# Patient Record
Sex: Female | Born: 2000 | Race: White | Hispanic: No
Health system: Midwestern US, Community
[De-identification: ages and names within clinical notes are randomized; demographics above are authoritative.]

## PROBLEM LIST (undated history)

## (undated) DIAGNOSIS — M779 Enthesopathy, unspecified: Secondary | ICD-10-CM

## (undated) DIAGNOSIS — K219 Gastro-esophageal reflux disease without esophagitis: Secondary | ICD-10-CM

## (undated) DIAGNOSIS — L309 Dermatitis, unspecified: Secondary | ICD-10-CM

## (undated) HISTORY — PX: ESOPHAGOSCOPY: SUR460

## (undated) HISTORY — PX: TONSILLECTOMY AND ADENOIDECTOMY: SUR1326

## (undated) HISTORY — PX: TYMPANOSTOMY TUBE PLACEMENT: SHX32

---
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
## 2021-07-04 NOTE — L&D Delivery Note (Cosign Needed Addendum)
OB/GYN Faculty Practice Delivery Note  Charlene Buckley is a 21 y.o. G1P0 s/p SVD at [redacted]w[redacted]d. She was admitted for IOL for maternal tachycardia.   ROM: 12h 65m with clear fluid GBS Status:  --Henderson Cloud (09/13 1330) Maximum Maternal Temperature:  Temp (24hrs), Avg:98.3 F (36.8 C), Min:98.1 F (36.7 C), Max:98.4 F (36.9 C)    Labor Progress: Patient arrived at 5 cm dilation and was induced with OP FB, cytotec, AROM, Pitocin.   Delivery Date/Time: 04/05/2022 at 1735 Delivery: Called to room and patient was complete and pushing. Head delivered in ROA position. Nuchal cord x1. Shoulder and body delivered in usual fashion. Infant with cry after stimulation, placed on mother's abdomen, dried and stimulated. Cord clamped x 2 after 1-minute delay, and cut by FOB. Cord blood drawn. Placenta delivered spontaneously with gentle cord traction and suprapubic pressure. Fundus firm with massage and Pitocin. Labia, perineum, vagina, and cervix inspected with mild bilateral labial and perineal abrasions.   Placenta: delivered at 4098 Complications: None Lacerations: mild bilateral labial and perineal abrasions EBL: 76 mL  Analgesia: epidural    Infant: APGAR (1 MIN): 8   APGAR (5 MINS): 9   APGAR (10 MINS):  pending  Weight: pending  Lowry Ram, MD  PGY-1, [facility]  04/05/2022 6:22 PM   Fellow ATTESTATION  I was present and gloved for this delivery and agree with the above documentation in the resident's note   [place], [place] for [employer] (Faculty Practice) 04/05/2022, 6:59 PM

## 2021-08-02 ENCOUNTER — Inpatient Hospital Stay (HOSPITAL_COMMUNITY)
Admission: AD | Admit: 2021-08-02 | Discharge: 2021-08-03 | Disposition: A | Discharge status: Home / Self Care | Payer: Medicaid Other | Attending: Obstetrics and Gynecology | Admitting: Obstetrics and Gynecology

## 2021-08-02 ENCOUNTER — Other Ambulatory Visit: Payer: Self-pay

## 2021-08-02 ENCOUNTER — Inpatient Hospital Stay (HOSPITAL_COMMUNITY): Payer: Medicaid Other

## 2021-08-02 ENCOUNTER — Encounter (HOSPITAL_COMMUNITY): Payer: Self-pay | Admitting: Emergency Medicine

## 2021-08-02 DIAGNOSIS — O26891 Other specified pregnancy related conditions, first trimester: Secondary | ICD-10-CM | POA: Diagnosis present

## 2021-08-02 DIAGNOSIS — Z6710 Type A blood, Rh positive: Secondary | ICD-10-CM | POA: Diagnosis not present

## 2021-08-02 DIAGNOSIS — R1030 Lower abdominal pain, unspecified: Secondary | ICD-10-CM | POA: Insufficient documentation

## 2021-08-02 DIAGNOSIS — O3680X0 Pregnancy with inconclusive fetal viability, not applicable or unspecified: Secondary | ICD-10-CM | POA: Diagnosis not present

## 2021-08-02 DIAGNOSIS — R110 Nausea: Secondary | ICD-10-CM | POA: Insufficient documentation

## 2021-08-02 DIAGNOSIS — R109 Unspecified abdominal pain: Secondary | ICD-10-CM | POA: Diagnosis not present

## 2021-08-02 DIAGNOSIS — Z3A01 Less than 8 weeks gestation of pregnancy: Secondary | ICD-10-CM | POA: Insufficient documentation

## 2021-08-02 LAB — I-STAT BETA HCG BLOOD, ED (MC, WL, AP ONLY): I-stat hCG, quantitative: 30.7 m[IU]/mL — ABNORMAL HIGH (ref <5)

## 2021-08-02 LAB — URINALYSIS, MICROSCOPIC (REFLEX)

## 2021-08-02 LAB — HCG, QUANTITATIVE, PREGNANCY: hCG, Beta Chain, Quant, S: 33 m[IU]/mL — ABNORMAL HIGH (ref <5)

## 2021-08-02 LAB — WET PREP, GENITAL
Clue Cells Wet Prep HPF POC: NONE SEEN
Sperm: NONE SEEN
Trich, Wet Prep: NONE SEEN
WBC, Wet Prep HPF POC: >=10 — AB (ref <10)
Yeast Wet Prep HPF POC: NONE SEEN

## 2021-08-02 LAB — URINALYSIS, ROUTINE W REFLEX MICROSCOPIC
Bilirubin Urine: NEGATIVE
Glucose, UA: NEGATIVE mg/dL
Hgb urine dipstick: NEGATIVE
Ketones, ur: NEGATIVE mg/dL
Nitrite: NEGATIVE
Protein, ur: NEGATIVE mg/dL
Specific Gravity, Urine: 1.01 (ref 1.005–1.030)
pH: 8 (ref 5.0–8.0)

## 2021-08-02 LAB — CBC
HCT: 36.5 % (ref 36.0–46.0)
Hemoglobin: 12.2 g/dL (ref 12.0–15.0)
MCH: 28.5 pg (ref 26.0–34.0)
MCHC: 33.4 g/dL (ref 30.0–36.0)
MCV: 85.3 fL (ref 80.0–100.0)
Platelets: 372 10*3/uL (ref 150–400)
RBC: 4.28 MIL/uL (ref 3.87–5.11)
RDW: 13.9 % (ref 11.5–15.5)
WBC: 7.7 10*3/uL (ref 4.0–10.5)
nRBC: 0 % (ref 0.0–0.2)

## 2021-08-02 LAB — ABO/RH: ABO/RH(D): A POS

## 2021-08-02 MED ORDER — ONDANSETRON 4 MG PO TBDP
8.0000 mg | ORAL_TABLET | Freq: Once | ORAL | Status: AC
  Administered 2021-08-02: 8 mg via ORAL
  Filled 2021-08-02: qty 2

## 2021-08-02 NOTE — MAU Note (Signed)
Pt transferred from ED with positive pregnancy test. Reports mid lower abdominal cramping that started about 2 hours ago. Denies any VB or abnormal discharge. Also reports nausea. Rates pain 6/10. LMP: 07/06/2021  Vitals:  T 98.7 P 87 BP 124/82 O2 100 R 18

## 2021-08-02 NOTE — ED Provider Triage Note (Signed)
Emergency Medicine Provider Triage Evaluation Note  Charlene Buckley , a 21 y.o. female  was evaluated in triage.  Pt complains of abdominal cramping. Pt took home pregnancy test that was positive. She has an OBGYN appointment tomorrow but states her abdominal cramping has been so bad in the past 2 hours she didn't want to wait.  Review of Systems  Positive: Abdominal cramping, nausea Negative: Vaginal bleeding, pelvic pain  Physical Exam  BP 125/80 (BP Location: Right Arm)    Pulse (!) 102    Temp 99.9 F (37.7 C) (Oral)    Resp 18    Ht 5\' 3"  (1.6 m)    Wt 68 kg    SpO2 99%    BMI 26.57 kg/m  Gen:   Awake, no distress   Resp:  Normal effort  MSK:   Moves extremities without difficulty  Other:    Medical Decision Making  Medically screening exam initiated at 9:04 PM.  Appropriate orders placed.  Charlene Buckley was informed that the remainder of the evaluation will be completed by another provider, this initial triage assessment does not replace that evaluation, and the importance of remaining in the ED until their evaluation is complete.  Called MAU physician who accepted patient transfer   Estill Cotta 08/02/21 2125

## 2021-08-02 NOTE — ED Notes (Signed)
Gave report to MAU.  Called transport.

## 2021-08-02 NOTE — ED Triage Notes (Signed)
Pt reports she took a home pregnancy test and found out that she was pregnant.She has a Dr. Appointment tomorrow however she is experiencing intense abdominal cramping X 2 hours.  No bleeding but some nausea

## 2021-08-03 DIAGNOSIS — O26891 Other specified pregnancy related conditions, first trimester: Secondary | ICD-10-CM | POA: Diagnosis not present

## 2021-08-03 DIAGNOSIS — R109 Unspecified abdominal pain: Secondary | ICD-10-CM

## 2021-08-03 DIAGNOSIS — O3680X0 Pregnancy with inconclusive fetal viability, not applicable or unspecified: Secondary | ICD-10-CM

## 2021-08-03 DIAGNOSIS — Z3A01 Less than 8 weeks gestation of pregnancy: Secondary | ICD-10-CM | POA: Diagnosis not present

## 2021-08-03 LAB — GC/CHLAMYDIA PROBE AMP (~~LOC~~) NOT AT ARMC
Chlamydia: NEGATIVE
Comment: NEGATIVE
Comment: NORMAL
Neisseria Gonorrhea: NEGATIVE

## 2021-08-03 NOTE — MAU Provider Note (Addendum)
Chief Complaint:  Abdominal Cramping   Event Date/Time   First Provider Initiated Contact with Patient 08/02/21 2227     HPI: Charlene Buckley is a 21 y.o. G1P0 at [redacted]w[redacted]d who presents to maternity admissions reporting centralized lower abdominal pain and tenderness. A few days ago, she noted very tender breasts and mostly right-sided abdominal pain which got worse and then better. Pain now central and breasts still tender with some nausea, but no vaginal bleeding or vomiting.   Pregnancy Course: Has not established care anywhere yet, is in the [employer].  History reviewed. No pertinent past medical history. OB History  Gravida Para Term Preterm AB Living  1            SAB IAB Ectopic Multiple Live Births               # Outcome Date GA Lbr Len/2nd Weight Sex Delivery Anes PTL Lv  1 Current            History reviewed. No pertinent surgical history. History reviewed. No pertinent family history. Social History   Tobacco Use   Smoking status: Never    Passive exposure: Never   Smokeless tobacco: Never  Substance Use Topics   Alcohol use: Never   Drug use: Never   Allergies  Allergen Reactions   Penicillins Anaphylaxis   No medications prior to admission.   I have reviewed patient's Past Medical Hx, Surgical Hx, Family Hx, Social Hx, medications and allergies.   ROS:  Review of Systems  Constitutional:  Negative for chills, fatigue and fever.  HENT:  Negative for congestion and sore throat.   Eyes:  Negative for visual disturbance.  Respiratory:  Negative for cough and shortness of breath.   Gastrointestinal:  Positive for abdominal pain and nausea.  Genitourinary:  Negative for dysuria, flank pain, urgency, vaginal bleeding, vaginal discharge and vaginal pain.  Neurological:  Negative for syncope and headaches.   Physical Exam  Patient Vitals for the past 24 hrs:  BP Temp Temp src Pulse Resp SpO2 Height Weight  08/03/21 0022 110/69 -- -- 81 -- -- -- --  08/02/21 2153  124/82 98.7 F (37.1 C) Oral 87 18 100 % 5\' 3"  (1.6 m) 147 lb 14.4 oz (67.1 kg)  08/02/21 2055 125/80 99.9 F (37.7 C) Oral (!) 102 18 99 % -- --  08/02/21 2051 -- -- -- -- -- -- 5\' 3"  (1.6 m) 150 lb (68 kg)   Constitutional: Well-developed, well-nourished female in no acute distress.  Cardiovascular: normal rate & rhythm Respiratory: normal effort GI: Abd soft, tender just above pubis symphysis MS: Extremities nontender, no edema, normal ROM Neurologic: Alert and oriented x 4.  GU: no CVA tenderness Pelvic: exam deferred, RN collected swabs, pt sent to U/S  Labs: Results for orders placed or performed during the hospital encounter of 08/02/21 (from the past 24 hour(s))  I-Stat Beta hCG blood, ED ([facility], [facility], [facility] only)     Status: Abnormal   Collection Time: 08/02/21  9:09 PM  Result Value Ref Range   I-stat hCG, quantitative 30.7 (H) <5 mIU/mL   Comment 3          CBC     Status: None   Collection Time: 08/02/21 10:31 PM  Result Value Ref Range   WBC 7.7 4.0 - 10.5 K/uL   RBC 4.28 3.87 - 5.11 MIL/uL   Hemoglobin 12.2 12.0 - 15.0 g/dL   HCT 40.3 47.4 - 25.9 %  MCV 85.3 80.0 - 100.0 fL   MCH 28.5 26.0 - 34.0 pg   MCHC 33.4 30.0 - 36.0 g/dL   RDW 13.9 11.5 - 15.5 %   Platelets 372 150 - 400 K/uL   nRBC 0.0 0.0 - 0.2 %  ABO/Rh     Status: None   Collection Time: 08/02/21 10:31 PM  Result Value Ref Range   ABO/RH(D) A POS    No rh immune globuloin      NOT A RH IMMUNE GLOBULIN CANDIDATE, PT RH POSITIVE Performed at [facility] [address], [place], [place] [address]   hCG, quantitative, pregnancy     Status: Abnormal   Collection Time: 08/02/21 10:31 PM  Result Value Ref Range   hCG, Beta Chain, Quant, S 33 (H) <5 mIU/mL  Wet prep, genital     Status: Abnormal   Collection Time: 08/02/21 10:57 PM  Result Value Ref Range   Yeast Wet Prep HPF POC NONE SEEN NONE SEEN   Trich, Wet Prep NONE SEEN NONE SEEN   Clue Cells Wet Prep HPF POC NONE SEEN NONE SEEN   WBC, Wet  Prep HPF POC >=10 (A) <10   Sperm NONE SEEN   Urinalysis, Routine w reflex microscopic     Status: Abnormal   Collection Time: 08/02/21 11:01 PM  Result Value Ref Range   Color, Urine YELLOW YELLOW   APPearance CLOUDY (A) CLEAR   Specific Gravity, Urine 1.010 1.005 - 1.030   pH 8.0 5.0 - 8.0   Glucose, UA NEGATIVE NEGATIVE mg/dL   Hgb urine dipstick NEGATIVE NEGATIVE   Bilirubin Urine NEGATIVE NEGATIVE   Ketones, ur NEGATIVE NEGATIVE mg/dL   Protein, ur NEGATIVE NEGATIVE mg/dL   Nitrite NEGATIVE NEGATIVE   Leukocytes,Ua TRACE (A) NEGATIVE  Urinalysis, Microscopic (reflex)     Status: Abnormal   Collection Time: 08/02/21 11:01 PM  Result Value Ref Range   RBC / HPF 0-5 0 - 5 RBC/hpf   WBC, UA 0-5 0 - 5 WBC/hpf   Bacteria, UA FEW (A) NONE SEEN   Squamous Epithelial / LPF 11-20 0 - 5   Mucus PRESENT    Amorphous Crystal PRESENT    Imaging:  US OB LESS THAN 14 WEEKS WITH OB TRANSVAGINAL  Result Date: 08/02/2021 CLINICAL DATA:  Pelvic pain for 2 hours with positive pregnancy test EXAM: OBSTETRIC <14 WK [place] AND TRANSVAGINAL OB US TECHNIQUE: Both transabdominal and transvaginal ultrasound examinations were performed for complete evaluation of the gestation as well as the maternal uterus, adnexal regions, and pelvic cul-de-sac. Transvaginal technique was performed to assess early pregnancy. COMPARISON:  None. FINDINGS: Intrauterine gestational sac: Absent Maternal uterus/adnexae: Ovaries appear within normal limits. Mild free fluid is noted which may be physiologic in nature. IMPRESSION: No evidence of intrauterine or extrauterine gestational sac. Given the low HCG level of 30.7 this likely represents an extremely early pregnancy. Correlation with follow-up beta HCG levels is recommended. Follow-up ultrasound can be performed as clinically necessary. Electronically Signed   By: Inez Catalina M.D.   On: 08/02/2021 23:41    MAU Course: Orders Placed This Encounter  Procedures   Wet prep,  genital   Culture, OB Urine   US OB LESS THAN 14 WEEKS WITH OB TRANSVAGINAL   CBC   hCG, quantitative, pregnancy   Urinalysis, Routine w reflex microscopic   Urinalysis, Microscopic (reflex)   I-Stat Beta hCG blood, ED ([facility], [facility], [facility] only)   ABO/Rh   Discharge patient  Meds ordered this encounter  Medications   ondansetron (ZOFRAN-ODT) disintegrating tablet 8 mg   MDM: Ectopic workup and UA ordered. Zofran given for nausea.  U/S showed nothing in uterus with HCG=30. Discussed pregnancy of unknown location with patient and need for follow up on Wednesday in our office to watch quantitative HCG. Reviewed ectopic precautions and reasons for returning to MAU. Advised if she has any light bleeding to follow up in the office on Wednesday as a quant would show viability of pregnancy. Pt verbalized understanding.  Urine culture ordered. Stat HCG appt scheduled on 08/04/21 at [facility].  Assessment: 1. [redacted] weeks gestation of pregnancy   2. Abdominal pain during pregnancy in first trimester   3. Pregnancy of unknown anatomic location    Plan: Discharge home in stable condition with ectopic precautions.     Follow-up [place] [facility] Follow up in 1 day(s).   Specialty: Obstetrics and Gynecology Why: 1:50pm on Wednesday for repeat bloodwork Contact information: [address] [place] [phone]                Allergies as of 08/03/2021       Reactions   Penicillins Anaphylaxis        Medication List    You have not been prescribed any medications.    Gaylan Gerold, CNM, MSN, [place] Certified Nurse Midwife, [facility]

## 2021-08-04 ENCOUNTER — Other Ambulatory Visit (INDEPENDENT_AMBULATORY_CARE_PROVIDER_SITE_OTHER): Payer: Medicaid Other

## 2021-08-04 ENCOUNTER — Other Ambulatory Visit: Payer: Self-pay

## 2021-08-04 VITALS — BP 102/67 | HR 80 | Wt 146.5 lb

## 2021-08-04 DIAGNOSIS — R109 Unspecified abdominal pain: Secondary | ICD-10-CM

## 2021-08-04 DIAGNOSIS — O26899 Other specified pregnancy related conditions, unspecified trimester: Secondary | ICD-10-CM

## 2021-08-04 LAB — CULTURE, OB URINE: Culture: >=100000 — AB

## 2021-08-04 LAB — BETA HCG QUANT (REF LAB): hCG Quant: 80 m[IU]/mL

## 2021-08-04 NOTE — Progress Notes (Signed)
Patient here today for stat hCG following up from MAU visit on 08/02/21 for abdominal pain. Patient denies any abdominal pain or vaginal bleeding today. Paitent's hCG on 08/02/21 was 30.7. Patient's hCG today was 80. I reviewed this with Dr. Para March. Per Dr. Para March patient to have an ultrasound in 2 weeks. Patient ultrasound scheduled for 08/18/21 at 1:45 PM. I called patient and reviewed this with her. I also reviewed ectopic precautions with patient. Patient denies any other concerns or questions.   Alesia Richards, RN 08/04/21

## 2021-08-05 ENCOUNTER — Telehealth: Payer: Self-pay | Admitting: Family Medicine

## 2021-08-05 DIAGNOSIS — R8271 Bacteriuria: Secondary | ICD-10-CM | POA: Insufficient documentation

## 2021-08-05 MED ORDER — NITROFURANTOIN MONOHYD MACRO 100 MG PO CAPS: 100.0000 mg | ORAL_CAPSULE | Freq: Two times a day (BID) | ORAL | 0 refills | Status: DC

## 2021-08-05 NOTE — Telephone Encounter (Signed)
See my chart message

## 2021-08-11 ENCOUNTER — Encounter (HOSPITAL_COMMUNITY): Payer: Self-pay | Admitting: Obstetrics and Gynecology

## 2021-08-11 ENCOUNTER — Other Ambulatory Visit: Payer: Self-pay

## 2021-08-11 ENCOUNTER — Inpatient Hospital Stay (HOSPITAL_COMMUNITY)
Admission: AD | Admit: 2021-08-11 | Discharge: 2021-08-11 | Disposition: A | Discharge status: Home / Self Care | Payer: Medicaid Other | Attending: Obstetrics and Gynecology | Admitting: Obstetrics and Gynecology

## 2021-08-11 ENCOUNTER — Inpatient Hospital Stay (HOSPITAL_COMMUNITY): Payer: Medicaid Other

## 2021-08-11 ENCOUNTER — Telehealth: Payer: Self-pay

## 2021-08-11 DIAGNOSIS — Z880 Allergy status to penicillin: Secondary | ICD-10-CM | POA: Diagnosis not present

## 2021-08-11 DIAGNOSIS — Z3A01 Less than 8 weeks gestation of pregnancy: Secondary | ICD-10-CM

## 2021-08-11 DIAGNOSIS — R1031 Right lower quadrant pain: Secondary | ICD-10-CM | POA: Diagnosis not present

## 2021-08-11 DIAGNOSIS — R109 Unspecified abdominal pain: Secondary | ICD-10-CM

## 2021-08-11 DIAGNOSIS — O26891 Other specified pregnancy related conditions, first trimester: Secondary | ICD-10-CM | POA: Diagnosis not present

## 2021-08-11 DIAGNOSIS — O3680X0 Pregnancy with inconclusive fetal viability, not applicable or unspecified: Secondary | ICD-10-CM

## 2021-08-11 HISTORY — DX: Enthesopathy, unspecified: M77.9

## 2021-08-11 HISTORY — DX: Dermatitis, unspecified: L30.9

## 2021-08-11 HISTORY — DX: Gastro-esophageal reflux disease without esophagitis: K21.9

## 2021-08-11 LAB — COMPREHENSIVE METABOLIC PANEL
ALT: 14 U/L (ref 0–44)
AST: 18 U/L (ref 15–41)
Albumin: 3.6 g/dL (ref 3.5–5.0)
Alkaline Phosphatase: 46 U/L (ref 38–126)
Anion gap: 8 (ref 5–15)
BUN: 8 mg/dL (ref 6–20)
CO2: 24 mmol/L (ref 22–32)
Calcium: 9 mg/dL (ref 8.9–10.3)
Chloride: 106 mmol/L (ref 98–111)
Creatinine, Ser: 0.69 mg/dL (ref 0.44–1.00)
GFR, Estimated: >60 mL/min (ref >60)
Glucose, Bld: 83 mg/dL (ref 70–99)
Potassium: 3.8 mmol/L (ref 3.5–5.1)
Sodium: 138 mmol/L (ref 135–145)
Total Bilirubin: 0.6 mg/dL (ref 0.3–1.2)
Total Protein: 6.1 g/dL — ABNORMAL LOW (ref 6.5–8.1)

## 2021-08-11 LAB — URINALYSIS, ROUTINE W REFLEX MICROSCOPIC
Bilirubin Urine: NEGATIVE
Glucose, UA: NEGATIVE mg/dL
Hgb urine dipstick: NEGATIVE
Ketones, ur: NEGATIVE mg/dL
Leukocytes,Ua: NEGATIVE
Nitrite: NEGATIVE
Protein, ur: NEGATIVE mg/dL
Specific Gravity, Urine: 1.016 (ref 1.005–1.030)
pH: 6 (ref 5.0–8.0)

## 2021-08-11 LAB — CBC
HCT: 33.8 % — ABNORMAL LOW (ref 36.0–46.0)
Hemoglobin: 11.2 g/dL — ABNORMAL LOW (ref 12.0–15.0)
MCH: 28.8 pg (ref 26.0–34.0)
MCHC: 33.1 g/dL (ref 30.0–36.0)
MCV: 86.9 fL (ref 80.0–100.0)
Platelets: 393 10*3/uL (ref 150–400)
RBC: 3.89 MIL/uL (ref 3.87–5.11)
RDW: 13.8 % (ref 11.5–15.5)
WBC: 7.1 10*3/uL (ref 4.0–10.5)
nRBC: 0 % (ref 0.0–0.2)

## 2021-08-11 LAB — HCG, QUANTITATIVE, PREGNANCY: hCG, Beta Chain, Quant, S: 2724 m[IU]/mL — ABNORMAL HIGH (ref <5)

## 2021-08-11 NOTE — MAU Note (Signed)
Presents with c/o sharp stabbing lower abdominal pain that began yesterday.  Denies VB.

## 2021-08-11 NOTE — MAU Note (Signed)
Discharged by MAU Provider.

## 2021-08-11 NOTE — Telephone Encounter (Signed)
Received message from MFM that pt called them stating that she was having cramping.  Called pt and pt informed me that her pain is 6/7 on pain scale and at times is not tolerable.  The cramping seems to switch sides.  I encouraged pt to go to MAU for evaluation.  Pt verbalized understanding with no further questions.   Leonette Nutting  08/11/21

## 2021-08-11 NOTE — MAU Provider Note (Signed)
History     CSN: [id number]  Arrival date and time: 08/11/21 1734   Event Date/Time   First Provider Initiated Contact with Patient 08/11/21 1832      Chief Complaint  Patient presents with   Abdominal Pain   HPI Charlene Buckley is a 21 y.o. G1P0 at [redacted]w[redacted]d who presents with abdominal cramping. Symptoms started a few days ago. Reports intermittent cramping in RLQ. Rates pain 6/10. Hasn't treated symptoms. Nothing makes better or worse. Has had some nausea since finding out she was pregnant. Denies fever/chills, vomiting, flank pain, dysuria, vaginal bleeding, or vaginal discharge.   OB History     Gravida  1   Para      Term      Preterm      AB      Living         SAB      IAB      Ectopic      Multiple      Live Births              Past Medical History:  Diagnosis Date   Eczema    GERD (gastroesophageal reflux disease)    Hx of Kawasaki's disease 2010   possible diagnosis   Tendonitis     Past Surgical History:  Procedure Laterality Date   ESOPHAGOSCOPY     TONSILLECTOMY AND ADENOIDECTOMY     TYMPANOSTOMY TUBE PLACEMENT      Family History  Problem Relation Age of Onset   Cancer Mother        cervical   Asthma Mother    Cancer Maternal Grandmother        cervical   Asthma Maternal Grandmother    Thyroid disease Maternal Grandmother    Heart disease Maternal Grandfather    Alcohol abuse Maternal Grandfather    Cancer Maternal Aunt        breast    Social History   Tobacco Use   Smoking status: Never    Passive exposure: Never   Smokeless tobacco: Never  Substance Use Topics   Alcohol use: Never   Drug use: Never    Allergies:  Allergies  Allergen Reactions   Penicillins Anaphylaxis   Cefuroxime Axetil     Medications Prior to Admission  Medication Sig Dispense Refill Last Dose   nitrofurantoin, macrocrystal-monohydrate, (MACROBID) 100 MG capsule Take 1 capsule (100 mg total) by mouth 2 (two) times daily. 10 capsule 0     Prenatal Vit-Fe Fumarate-FA (PRENATAL MULTIVITAMIN) TABS tablet Take 1 tablet by mouth daily at 12 noon.       Review of Systems  Constitutional: Negative.   Gastrointestinal:  Positive for abdominal pain and nausea. Negative for constipation, diarrhea and vomiting.  Genitourinary: Negative.   Physical Exam   Blood pressure 117/68, pulse 87, temperature 98.1 F (36.7 C), temperature source Oral, resp. rate 19, height 5\' 3"  (1.6 m), weight 68.4 kg, last menstrual period 07/06/2021, SpO2 97 %.  Physical Exam Vitals and nursing note reviewed.  Constitutional:      General: She is not in acute distress.    Appearance: She is well-developed.  HENT:     Head: Normocephalic and atraumatic.  Eyes:     General: No scleral icterus.    Extraocular Movements: Extraocular movements intact.     Pupils: Pupils are equal, round, and reactive to light.  Pulmonary:     Effort: Pulmonary effort is normal. No respiratory distress.  Abdominal:  General: Abdomen is flat.     Palpations: Abdomen is soft.     Tenderness: There is no abdominal tenderness. There is no right CVA tenderness, left CVA tenderness, guarding or rebound.  Neurological:     General: No focal deficit present.     Mental Status: She is alert.  Psychiatric:        Mood and Affect: Mood normal.        Behavior: Behavior normal.    MAU Course  Procedures Results for orders placed or performed during the hospital encounter of 08/11/21 (from the past 24 hour(s))  Urinalysis, Routine w reflex microscopic Urine, Clean Catch     Status: Abnormal   Collection Time: 08/11/21  6:15 PM  Result Value Ref Range   Color, Urine YELLOW YELLOW   APPearance HAZY (A) CLEAR   Specific Gravity, Urine 1.016 1.005 - 1.030   pH 6.0 5.0 - 8.0   Glucose, UA NEGATIVE NEGATIVE mg/dL   Hgb urine dipstick NEGATIVE NEGATIVE   Bilirubin Urine NEGATIVE NEGATIVE   Ketones, ur NEGATIVE NEGATIVE mg/dL   Protein, ur NEGATIVE NEGATIVE mg/dL   Nitrite  NEGATIVE NEGATIVE   Leukocytes,Ua NEGATIVE NEGATIVE  CBC     Status: Abnormal   Collection Time: 08/11/21  6:35 PM  Result Value Ref Range   WBC 7.1 4.0 - 10.5 K/uL   RBC 3.89 3.87 - 5.11 MIL/uL   Hemoglobin 11.2 (L) 12.0 - 15.0 g/dL   HCT 40.9 (L) 73.5 - 32.9 %   MCV 86.9 80.0 - 100.0 fL   MCH 28.8 26.0 - 34.0 pg   MCHC 33.1 30.0 - 36.0 g/dL   RDW 92.4 26.8 - 34.1 %   Platelets 393 150 - 400 K/uL   nRBC 0.0 0.0 - 0.2 %  hCG, quantitative, pregnancy     Status: Abnormal   Collection Time: 08/11/21  6:35 PM  Result Value Ref Range   hCG, Beta Chain, Quant, S 2,724 (H) <5 mIU/mL  Comprehensive metabolic panel     Status: Abnormal   Collection Time: 08/11/21  6:35 PM  Result Value Ref Range   Sodium 138 135 - 145 mmol/L   Potassium 3.8 3.5 - 5.1 mmol/L   Chloride 106 98 - 111 mmol/L   CO2 24 22 - 32 mmol/L   Glucose, Bld 83 70 - 99 mg/dL   BUN 8 6 - 20 mg/dL   Creatinine, Ser 9.62 0.44 - 1.00 mg/dL   Calcium 9.0 8.9 - 22.9 mg/dL   Total Protein 6.1 (L) 6.5 - 8.1 g/dL   Albumin 3.6 3.5 - 5.0 g/dL   AST 18 15 - 41 U/L   ALT 14 0 - 44 U/L   Alkaline Phosphatase 46 38 - 126 U/L   Total Bilirubin 0.6 0.3 - 1.2 mg/dL   GFR, Estimated >79 >89 mL/min   Anion gap 8 5 - 15   US OB Transvaginal  Result Date: 08/11/2021 CLINICAL DATA:  Pain since yesterday EXAM: TRANSVAGINAL OB ULTRASOUND TECHNIQUE: Transvaginal ultrasound was performed for complete evaluation of the gestation as well as the maternal uterus, adnexal regions, and pelvic cul-de-sac. COMPARISON:  None. FINDINGS: Intrauterine gestational sac: None Yolk sac:  Not Visualized. Embryo:  Not Visualized. Cardiac Activity: Not Visualized. Heart Rate:  bpm MSD:   mm    w     d CRL:     mm    w  d  [place] EDC: Subchorionic hemorrhage:  None visualized. Maternal uterus/adnexae: No adnexal mass. Small amount of free fluid in the pelvis. IMPRESSION: No intrauterine pregnancy visualized. Differential considerations would include  early intrauterine pregnancy too early to visualize, spontaneous abortion, or occult ectopic pregnancy. Recommend close clinical followup and serial quantitative beta HCGs and ultrasounds. Electronically Signed   By: Charlett Nose M.D.   On: 08/11/2021 20:08    MDM Patient seen last week for abdominal pain. Had HCG that rose from 33 to 80 and was schedule for f/u viability scan. That ultrasound is scheduled for next week.   Today her HCG is 2724. Ultrasound shows no IUP or adnexal mass. Small amount of free fluid.  Reviewed with Dr. Donavan Foil. Recommends f/u HCG on Saturday.   Discussed results with patient. Possibility includes normal pregnancy too early to be seen, miscarriage, or ectopic pregnancy. Ectopic pregnancy can be an emergency - reviewed reasons to return to MAU.  Benign abdominal exam, afebrile, and no leukocytosis. Reviewed signs concerning for appendicitis & reasons to return.   Assessment and Plan   1. Pregnancy of unknown anatomic location   2. Abdominal pain during pregnancy in first trimester   3. [redacted] weeks gestation of pregnancy    -scheduled to return Saturday morning for stat hcg -reviewed return precautions  Judeth Horn 08/11/2021, 9:20 PM

## 2021-08-11 NOTE — Discharge Instructions (Signed)
Return to care  If you have heavier bleeding that soaks through more than 2 pads per hour for an hour or more If you bleed so much that you feel like you might pass out or you do pass out If you have significant abdominal pain that is not improved with Tylenol   

## 2021-08-14 ENCOUNTER — Encounter (HOSPITAL_COMMUNITY): Payer: Self-pay

## 2021-08-14 ENCOUNTER — Inpatient Hospital Stay (HOSPITAL_COMMUNITY)
Admission: AD | Admit: 2021-08-14 | Discharge: 2021-08-14 | Disposition: A | Discharge status: Home / Self Care | Payer: Medicaid Other | Attending: Obstetrics and Gynecology | Admitting: Obstetrics and Gynecology

## 2021-08-14 ENCOUNTER — Other Ambulatory Visit: Payer: Self-pay

## 2021-08-14 ENCOUNTER — Inpatient Hospital Stay (HOSPITAL_COMMUNITY): Admit: 2021-08-14 | Payer: Medicaid Other

## 2021-08-14 DIAGNOSIS — O3680X0 Pregnancy with inconclusive fetal viability, not applicable or unspecified: Secondary | ICD-10-CM | POA: Diagnosis not present

## 2021-08-14 DIAGNOSIS — Z3A01 Less than 8 weeks gestation of pregnancy: Secondary | ICD-10-CM | POA: Diagnosis not present

## 2021-08-14 DIAGNOSIS — Z6790 Unspecified blood type, Rh positive: Secondary | ICD-10-CM | POA: Insufficient documentation

## 2021-08-14 DIAGNOSIS — E349 Endocrine disorder, unspecified: Secondary | ICD-10-CM | POA: Diagnosis not present

## 2021-08-14 LAB — HCG, QUANTITATIVE, PREGNANCY: hCG, Beta Chain, Quant, S: 8278 m[IU]/mL — ABNORMAL HIGH (ref <5)

## 2021-08-14 NOTE — MAU Provider Note (Signed)
Subjective:  Charlene Buckley is a 21 y.o. G1P0 at [redacted]w[redacted]d who presents today for FU BHCG. She was seen on 08/11/2021. Results from that day show no IUP on [place], and HCG 2,724. She denies vaginal bleeding. She denies abdominal or pelvic pain.    Objective:  Physical Exam  Nursing note and vitals reviewed. Patient Vitals for the past 24 hrs:  BP Temp Temp src Pulse Resp SpO2 Height Weight  08/14/21 0938 111/61 98.2 F (36.8 C) Oral 94 16 100 % 5\' 3"  (1.6 m) 68 kg   Constitutional: She is oriented to person, place, and time. She appears well-developed and well-nourished. No distress.  HENT:  Head: Normocephalic.  Cardiovascular: Normal rate.  Respiratory: Effort normal.  GI: Soft. There is no tenderness.  Neurological: She is alert and oriented to person, place, and time. Skin: Skin is warm and dry.  Psychiatric: She has a normal mood and affect.   Results for orders placed or performed during the hospital encounter of 08/14/21 (from the past 24 hour(s))  hCG, quantitative, pregnancy     Status: Abnormal   Collection Time: 08/14/21  9:47 AM  Result Value Ref Range   hCG, Beta Chain, Quant, S 8,278 (H) <5 mIU/mL    Assessment/Plan: Pregnancy of unknown location HCG did  rise appropriately FU in 4 days for: scheduled 10/12/21 Ectopic precautions given Patient discharged from MAU in stable condition  Karson Reede, [place], NP  11:18 AM 08/14/2021

## 2021-08-14 NOTE — Discharge Instructions (Signed)
Safe Medications in Pregnancy    Acne: Benzoyl Peroxide Salicylic Acid  Backache/Headache: Tylenol: 2 regular strength every 4 hours OR              2 Extra strength every 6 hours  Colds/Coughs/Allergies: Benadryl (alcohol free) 25 mg every 6 hours as needed Breath right strips Claritin Cepacol throat lozenges Chloraseptic throat spray Cold-Eeze- up to three times per day Cough drops, alcohol free Flonase (by prescription only) Guaifenesin Mucinex Robitussin DM (plain only, alcohol free) Saline nasal spray/drops Sudafed (pseudoephedrine) & Actifed ** use only after [redacted] weeks gestation and if you do not have high blood pressure Tylenol Vicks Vaporub Zinc lozenges Zyrtec   Constipation: Colace Ducolax suppositories Fleet enema Glycerin suppositories Metamucil Milk of magnesia Miralax Senokot Smooth move tea  Diarrhea: Kaopectate Imodium A-D  *NO pepto Bismol  Hemorrhoids: Anusol Anusol HC Preparation H Tucks  Indigestion: Tums Maalox Mylanta Zantac  Pepcid  Insomnia: Benadryl (alcohol free) 25mg every 6 hours as needed Tylenol PM Unisom, no Gelcaps  Leg Cramps: Tums MagGel  Nausea/Vomiting:  Bonine Dramamine Emetrol Ginger extract Sea bands Meclizine  Nausea medication to take during pregnancy:  Unisom (doxylamine succinate 25 mg tablets) Take one tablet daily at bedtime. If symptoms are not adequately controlled, the dose can be increased to a maximum recommended dose of two tablets daily (1/2 tablet in the morning, 1/2 tablet mid-afternoon and one at bedtime). Vitamin B6 100mg tablets. Take one tablet twice a day (up to 200 mg per day).  Skin Rashes: Aveeno products Benadryl cream or 25mg every 6 hours as needed Calamine Lotion 1% cortisone cream  Yeast infection: Gyne-lotrimin 7 Monistat 7   **If taking multiple medications, please check labels to avoid duplicating the same active ingredients **take  medication as directed on the label ** Do not exceed 4000 mg of tylenol in 24 hours **Do not take medications that contain aspirin or ibuprofen         Prenatal Care Providers           [facility] - accepts patients without insurance  Phone: [phone]  [facility] @ [facility]   Phone: [phone]  [facility]       Phone: [phone]            [facility] @ [place]     Phone: [phone]          [facility] @ [place]   Phone: [phone]  [facility] @ [facility] - accepts patients without insurance  Phone: [phone]  [facility] @ [facility] Phone: [phone]     [facility] - accepts patients without insurance Phone: [phone]  [facility]  Phone: [phone]  [facility] Phone: [phone]  [facility] Phone: [phone]  [facility] Phone: [phone]  [facility] Phone: [phone]  [facility]  Phone: [phone]  

## 2021-08-14 NOTE — MAU Note (Signed)
Doing well. The pain she was having has gone away.  Yesterday she felt normal. Denies bleeding. Here for rpt blood work.

## 2021-08-18 ENCOUNTER — Ambulatory Visit
Admission: RE | Admit: 2021-08-18 | Discharge: 2021-08-18 | Disposition: A | Discharge status: Home / Self Care | Payer: Medicaid Other | Source: Ambulatory Visit | Attending: Obstetrics and Gynecology | Admitting: Obstetrics and Gynecology

## 2021-08-18 ENCOUNTER — Telehealth: Payer: Self-pay

## 2021-08-18 ENCOUNTER — Other Ambulatory Visit: Payer: Self-pay

## 2021-08-18 DIAGNOSIS — O26899 Other specified pregnancy related conditions, unspecified trimester: Secondary | ICD-10-CM | POA: Diagnosis present

## 2021-08-18 DIAGNOSIS — Z3A01 Less than 8 weeks gestation of pregnancy: Secondary | ICD-10-CM

## 2021-08-18 DIAGNOSIS — R109 Unspecified abdominal pain: Secondary | ICD-10-CM | POA: Insufficient documentation

## 2021-08-18 DIAGNOSIS — Z3491 Encounter for supervision of normal pregnancy, unspecified, first trimester: Secondary | ICD-10-CM

## 2021-08-18 NOTE — Telephone Encounter (Signed)
I called Charlene Buckley today at 5:16 PM and confirmed patient's identity using two patient identifiers. [place] results from earlier today were reviewed. Reviewed fetal bradycardia and what it could indicate. Discussed recommendation for repeat ultrasound in 2 weeks and patient agreeable to plan of care. Patient is not yet scheduled for new OB visit. First trimester warning signs reviewed. Patient voiced understanding and had no further questions.   US OB LESS THAN 14 WEEKS WITH OB TRANSVAGINAL  Result Date: 08/18/2021 CLINICAL DATA:  Viability and dating EXAM: OBSTETRIC <14 WK [place] AND TRANSVAGINAL OB US TECHNIQUE: Both transabdominal and transvaginal ultrasound examinations were performed for complete evaluation of the gestation as well as the maternal uterus, adnexal regions, and pelvic cul-de-sac. Transvaginal technique was performed to assess early pregnancy. COMPARISON:  08/11/2021, 08/02/2021 FINDINGS: Intrauterine gestational sac: Single Yolk sac:  Visualized Embryo:  Visualized Cardiac Activity: Visualized Heart Rate: 98 bpm CRL:  37 mm   6 w   1 d                  [place] EDC: 04/12/2022 Subchorionic hemorrhage:  None visualized. Maternal uterus/adnexae: Ovaries are within normal limits. Right ovary measures 2.9 x 1.8 x 3.5 cm. Left ovary measures 3.1 x 1.6 x 2.3 cm. Small moderate free fluid in the pelvis. IMPRESSION: 1. Single viable intrauterine pregnancy, but with fetal cardiac activity of 98 bpm/mild bradycardia. 2. Small moderate free fluid in the pelvis. Electronically Signed   By: Jasmine Pang M.D.   On: 08/18/2021 16:13    Rolm Bookbinder, CNM 08/18/2021 5:16 PM

## 2021-09-01 ENCOUNTER — Ambulatory Visit
Admission: RE | Admit: 2021-09-01 | Discharge: 2021-09-01 | Disposition: A | Discharge status: Home / Self Care | Payer: Medicaid Other | Source: Ambulatory Visit

## 2021-09-01 ENCOUNTER — Other Ambulatory Visit: Payer: Self-pay

## 2021-09-01 DIAGNOSIS — Z3491 Encounter for supervision of normal pregnancy, unspecified, first trimester: Secondary | ICD-10-CM

## 2021-09-01 DIAGNOSIS — O368310 Maternal care for abnormalities of the fetal heart rate or rhythm, first trimester, not applicable or unspecified: Secondary | ICD-10-CM | POA: Diagnosis not present

## 2021-09-01 DIAGNOSIS — Z3A01 Less than 8 weeks gestation of pregnancy: Secondary | ICD-10-CM

## 2021-09-01 DIAGNOSIS — Z3A08 8 weeks gestation of pregnancy: Secondary | ICD-10-CM | POA: Insufficient documentation

## 2021-09-07 LAB — OB RESULTS CONSOLE RUBELLA ANTIBODY, IGM: Rubella: IMMUNE

## 2021-09-07 LAB — HEPATITIS C ANTIBODY: HCV Ab: NEGATIVE

## 2021-09-07 LAB — OB RESULTS CONSOLE HEPATITIS B SURFACE ANTIGEN: Hepatitis B Surface Ag: NEGATIVE

## 2021-10-05 ENCOUNTER — Other Ambulatory Visit: Payer: Self-pay

## 2021-10-05 ENCOUNTER — Inpatient Hospital Stay (HOSPITAL_COMMUNITY)
Admission: AD | Admit: 2021-10-05 | Discharge: 2021-10-05 | Disposition: A | Discharge status: Home / Self Care | Payer: Medicaid Other | Attending: Family Medicine | Admitting: Family Medicine

## 2021-10-05 ENCOUNTER — Encounter (HOSPITAL_COMMUNITY): Payer: Self-pay | Admitting: Family Medicine

## 2021-10-05 ENCOUNTER — Encounter: Payer: Self-pay | Admitting: Student

## 2021-10-05 DIAGNOSIS — Z3A13 13 weeks gestation of pregnancy: Secondary | ICD-10-CM | POA: Insufficient documentation

## 2021-10-05 DIAGNOSIS — O210 Mild hyperemesis gravidarum: Secondary | ICD-10-CM | POA: Diagnosis not present

## 2021-10-05 DIAGNOSIS — O219 Vomiting of pregnancy, unspecified: Secondary | ICD-10-CM | POA: Diagnosis not present

## 2021-10-05 LAB — URINALYSIS, ROUTINE W REFLEX MICROSCOPIC
Bilirubin Urine: NEGATIVE
Glucose, UA: NEGATIVE mg/dL
Hgb urine dipstick: NEGATIVE
Ketones, ur: 20 mg/dL — AB
Nitrite: NEGATIVE
Protein, ur: NEGATIVE mg/dL
Specific Gravity, Urine: 1.025 (ref 1.005–1.030)
pH: 6 (ref 5.0–8.0)

## 2021-10-05 MED ORDER — ONDANSETRON 4 MG PO TBDP
8.0000 mg | ORAL_TABLET | Freq: Once | ORAL | Status: AC
  Administered 2021-10-05: 8 mg via ORAL
  Filled 2021-10-05: qty 2

## 2021-10-05 MED ORDER — ONDANSETRON 8 MG PO TBDP: 8.0000 mg | ORAL_TABLET | Freq: Three times a day (TID) | ORAL | 1 refills | Status: DC | PRN

## 2021-10-05 NOTE — MAU Note (Signed)
Charlene Buckley is a 21 y.o. at [redacted]w[redacted]d here in MAU reporting: N/V that began 2 weeks ago, states unable to keep anything down.  States had syncope episode today @ 1130.  Denies VB. ? ?Onset of complaint: 2 weeks ago ?Pain score: 0 ?Vitals:  ? 10/05/21 1341  ?BP: 116/69  ?Pulse: 74  ?Resp: 18  ?Temp: 98.5 ?F (36.9 ?C)  ?SpO2: 100%  ?   ?FHT:161 bpm ?Lab orders placed from triage:   UA ?

## 2021-10-05 NOTE — MAU Provider Note (Signed)
Patient Charlene Buckley is a 21 y.o. G1P0 ? At [redacted]w[redacted]d here with complaints of nausea, vomiting and "passing out". She reports that she gets light headed when she stands up. Today she stood up at 11:30 am and felt dizzy; she feel backwards and hit her head on the bed (did not hit her head on the floor).  ? ?She denies vaginal bleeding, concerns for miscarriage. She denies fever, SOB, chest pain, diarrhea. She denies abdominal pain, dysuria. She reports that she has been vomiting 2-4 times a day and her MD in [place] prescribed her Diclegis, but it isn't working.  ?History  ?  ? ?CSN: [id number] ? ?Arrival date and time: 10/05/21 1252 ? ? Event Date/ ? First Provider Initiated Contact with Patient 10/05/21 1530   ?  ? ?Chief Complaint  ?Patient presents with  ? Nausea  ? Emesis  ? Syncope Episode  ? ?Emesis  ?This is a new problem. The current episode started 1 to 4 weeks ago. The problem occurs 2 to 4 times per day. The problem has been gradually worsening. The emesis has an appearance of stomach contents and bile. There has been no fever. Pertinent negatives include no abdominal pain, coughing, diarrhea or fever.  ? ?OB History   ? ? Gravida  ?1  ? Para  ?   ? Term  ?   ? Preterm  ?   ? AB  ?   ? Living  ?   ?  ? ? SAB  ?   ? IAB  ?   ? Ectopic  ?   ? Multiple  ?   ? Live Births  ?   ?   ?  ?  ? ? ?Past Medical History:  ?Diagnosis Date  ? Eczema   ? GERD (gastroesophageal reflux disease)   ? Hx of Kawasaki's disease 2010  ? possible diagnosis  ? Tendonitis   ? ? ?Past Surgical History:  ?Procedure Laterality Date  ? ESOPHAGOSCOPY    ? TONSILLECTOMY AND ADENOIDECTOMY    ? TYMPANOSTOMY TUBE PLACEMENT    ? ? ?Family History  ?Problem Relation Age of Onset  ? Cancer Mother   ?     cervical  ? Asthma Mother   ? Cancer Maternal Aunt   ?     breast  ? Cancer Maternal Grandmother   ?     cervical  ? Asthma Maternal Grandmother   ? Thyroid disease Maternal Grandmother   ? Heart disease Maternal Grandfather   ? Alcohol abuse Maternal  Grandfather   ? ? ?Social History  ? ?Tobacco Use  ? Smoking status: Never  ?  Passive exposure: Never  ? Smokeless tobacco: Never  ?Vaping Use  ? Vaping Use: Never used  ?Substance Use Topics  ? Alcohol use: Never  ? Drug use: Never  ? ? ?Allergies:  ?Allergies  ?Allergen Reactions  ? Penicillins Anaphylaxis  ? Cefuroxime Axetil   ? ? ?No medications prior to admission.  ? ? ?Review of Systems  ?Constitutional:  Negative for fever.  ?Respiratory:  Negative for cough.   ?Gastrointestinal:  Positive for vomiting. Negative for abdominal pain and diarrhea.  ?Genitourinary: Negative.   ?Musculoskeletal: Negative.   ?Neurological: Negative.   ?Hematological: Negative.   ?Psychiatric/Behavioral: Negative.    ?Physical Exam  ? ?Blood pressure 112/60, pulse 65, temperature 98.4 ?F (36.9 ?C), temperature source Oral, resp. rate 19, height 5\' 3"  (1.6 m), weight 65.6 kg, last menstrual period 07/06/2021, SpO2 100 %. ? ?  Physical Exam ?Constitutional:   ?   Appearance: Normal appearance.  ?Cardiovascular:  ?   Rate and Rhythm: Normal rate.  ?Pulmonary:  ?   Effort: Pulmonary effort is normal.  ?Abdominal:  ?   General: Abdomen is flat.  ?Musculoskeletal:     ?   General: Normal range of motion.  ?Skin: ?   General: Skin is warm.  ?Neurological:  ?   General: No focal deficit present.  ?   Mental Status: She is alert.  ?Psychiatric:     ?   Mood and Affect: Mood normal.  ? ? ?MAU Course  ?Procedures ? ?MDM ?-UA shows 20 of ketones ?-patient had 8 mg of zofran and tolerated PO; she felt well and did not have any episodes of vomiting while in MAU ?-EKG showed possible arrhtymia but could also be artificat;  otherwise normal ? ?Assessment and Plan  ? ?1. Nausea and vomiting during pregnancy prior to [redacted] weeks gestation   ?2. [redacted] weeks gestation of pregnancy   ? ?-Rx for zofran given, as well as constipation precautions and other first trimester warnings given ?-keep appt on 4/14 in [place] ?-patient was discharged before official EKG  read was available;attempted to contact patient by [facility] but no answer so My Chart Message with EKG results sent to patient.  ?Mervyn Skeeters Terez Freimark ?10/06/2021, 11:10 AM  ?

## 2021-11-22 ENCOUNTER — Emergency Department (HOSPITAL_BASED_OUTPATIENT_CLINIC_OR_DEPARTMENT_OTHER)
Admission: EM | Admit: 2021-11-22 | Discharge: 2021-11-22 | Disposition: A | Discharge status: Home / Self Care | Payer: Medicaid Other | Attending: Emergency Medicine | Admitting: Emergency Medicine

## 2021-11-22 ENCOUNTER — Encounter (HOSPITAL_BASED_OUTPATIENT_CLINIC_OR_DEPARTMENT_OTHER): Payer: Self-pay

## 2021-11-22 ENCOUNTER — Other Ambulatory Visit: Payer: Self-pay

## 2021-11-22 DIAGNOSIS — R197 Diarrhea, unspecified: Secondary | ICD-10-CM | POA: Insufficient documentation

## 2021-11-22 DIAGNOSIS — R8271 Bacteriuria: Secondary | ICD-10-CM | POA: Diagnosis not present

## 2021-11-22 DIAGNOSIS — O26892 Other specified pregnancy related conditions, second trimester: Secondary | ICD-10-CM | POA: Insufficient documentation

## 2021-11-22 DIAGNOSIS — R112 Nausea with vomiting, unspecified: Secondary | ICD-10-CM

## 2021-11-22 DIAGNOSIS — R000 Tachycardia, unspecified: Secondary | ICD-10-CM | POA: Insufficient documentation

## 2021-11-22 DIAGNOSIS — O99891 Other specified diseases and conditions complicating pregnancy: Secondary | ICD-10-CM

## 2021-11-22 DIAGNOSIS — O219 Vomiting of pregnancy, unspecified: Secondary | ICD-10-CM | POA: Insufficient documentation

## 2021-11-22 DIAGNOSIS — Z3A20 20 weeks gestation of pregnancy: Secondary | ICD-10-CM | POA: Insufficient documentation

## 2021-11-22 LAB — CBC
HCT: 37.6 % (ref 36.0–46.0)
Hemoglobin: 12.5 g/dL (ref 12.0–15.0)
MCH: 28.7 pg (ref 26.0–34.0)
MCHC: 33.2 g/dL (ref 30.0–36.0)
MCV: 86.2 fL (ref 80.0–100.0)
Platelets: 361 10*3/uL (ref 150–400)
RBC: 4.36 MIL/uL (ref 3.87–5.11)
RDW: 13.7 % (ref 11.5–15.5)
WBC: 10.6 10*3/uL — ABNORMAL HIGH (ref 4.0–10.5)
nRBC: 0 % (ref 0.0–0.2)

## 2021-11-22 LAB — URINALYSIS, ROUTINE W REFLEX MICROSCOPIC
Bilirubin Urine: NEGATIVE
Glucose, UA: NEGATIVE mg/dL
Hgb urine dipstick: NEGATIVE
Ketones, ur: >80 mg/dL — AB
Nitrite: NEGATIVE
Protein, ur: 30 mg/dL — AB
Specific Gravity, Urine: 1.031 — ABNORMAL HIGH (ref 1.005–1.030)
pH: 6 (ref 5.0–8.0)

## 2021-11-22 LAB — COMPREHENSIVE METABOLIC PANEL
ALT: 7 U/L (ref 0–44)
AST: 12 U/L — ABNORMAL LOW (ref 15–41)
Albumin: 4.2 g/dL (ref 3.5–5.0)
Alkaline Phosphatase: 49 U/L (ref 38–126)
Anion gap: 12 (ref 5–15)
BUN: 10 mg/dL (ref 6–20)
CO2: 21 mmol/L — ABNORMAL LOW (ref 22–32)
Calcium: 9.1 mg/dL (ref 8.9–10.3)
Chloride: 104 mmol/L (ref 98–111)
Creatinine, Ser: 0.66 mg/dL (ref 0.44–1.00)
GFR, Estimated: >60 mL/min (ref >60)
Glucose, Bld: 83 mg/dL (ref 70–99)
Potassium: 3.9 mmol/L (ref 3.5–5.1)
Sodium: 137 mmol/L (ref 135–145)
Total Bilirubin: 1 mg/dL (ref 0.3–1.2)
Total Protein: 7.2 g/dL (ref 6.5–8.1)

## 2021-11-22 LAB — LIPASE, BLOOD: Lipase: <10 U/L — ABNORMAL LOW (ref 11–51)

## 2021-11-22 MED ORDER — ONDANSETRON HCL 4 MG/2ML IJ SOLN
4.0000 mg | Freq: Once | INTRAMUSCULAR | Status: AC
Start: 2021-11-22 — End: 2021-11-22
  Administered 2021-11-22: 4 mg via INTRAVENOUS
  Filled 2021-11-22: qty 2

## 2021-11-22 MED ORDER — LACTATED RINGERS IV BOLUS
1500.0000 mL | Freq: Once | INTRAVENOUS | Status: AC
  Administered 2021-11-22: 1500 mL via INTRAVENOUS

## 2021-11-22 MED ORDER — FOSFOMYCIN TROMETHAMINE 3 G PO PACK
3.0000 g | PACK | Freq: Once | ORAL | Status: AC
  Administered 2021-11-22: 3 g via ORAL
  Filled 2021-11-22: qty 3

## 2021-11-22 MED ORDER — ONDANSETRON 4 MG PO TBDP: 4.0000 mg | ORAL_TABLET | Freq: Three times a day (TID) | ORAL | 0 refills | Status: DC | PRN

## 2021-11-22 NOTE — Discharge Instructions (Addendum)
I am prescribing you a medication called Zofran.  This is a disintegrating tablet you can use up to 3 times a day for management of your nausea and vomiting.  Please only take this as prescribed.  Please only take this if you are experiencing nausea and vomiting that you cannot control.  Please follow-up with your OB/GYN regarding your symptoms as well as this visit today.  If you develop any new or worsening symptoms whatsoever please come back to the emergency department immediately for reevaluation.

## 2021-11-22 NOTE — ED Triage Notes (Addendum)
Pt arrives POV c/o emesis starting this morning.  Pt says she and her family became ill after eating at [facility].  Pt is [redacted] weeks pregnant and says she has been feeling the baby move throughout the day.  States she has had about 9 emesis episodes today.  She says emesis has blood in it.  She has had about 4 episodes of light brown watery diarrhea.  Pt ambulatory to triage, appears pale but is in NAD.

## 2021-11-22 NOTE — ED Provider Notes (Signed)
[facility]   CSN: [id number] Arrival date & time: 11/22/21  1927     History  Chief Complaint  Patient presents with   Emesis    Charlene Buckley M Charlene Buckley is a 21 y.o. female.  HPI Patient is a 21 year old G1 T0 P0 A0 L0 female who is approximately [redacted] weeks pregnant.  She presents to the emergency department due to nausea, vomiting, and diarrhea that started this morning.  She states that her family ate at [employer] this weekend and also [place] [employer] [place].  Since coming home everyone in her family is experiencing similar symptoms.  Reports some mild shortness of breath but states that this has been ongoing while she has been pregnant.  Denies that it is acutely worsened.  No chest pain, abdominal pain.  States she has felt the baby kicking today.    Home Medications Prior to Admission medications   Medication Sig Start Date End Date Taking? Authorizing Provider  ondansetron (ZOFRAN-ODT) 4 MG disintegrating tablet Take 1 tablet (4 mg total) by mouth every 8 (eight) hours as needed for nausea or vomiting. 11/22/21  Yes Placido SouJoldersma, Lachanda Buczek, PA-C  Prenatal Vit-Fe Fumarate-FA (PRENATAL MULTIVITAMIN) TABS tablet Take 1 tablet by mouth daily at 12 noon.    [provider]      Allergies    Penicillins and Cefuroxime axetil    Review of Systems   Review of Systems  All other systems reviewed and are negative. Ten systems reviewed and are negative for acute change, except as noted in the HPI.   Physical Exam Updated Vital Signs BP 110/70   Pulse 98   Temp 98.5 F (36.9 C)   Resp 16   Ht 5\' 3"  (1.6 m)   Wt 68 kg   LMP 07/06/2021   SpO2 100%   BMI 26.57 kg/m  Physical Exam Vitals and nursing note reviewed.  Constitutional:      General: She is not in acute distress.    Appearance: Normal appearance. She is not ill-appearing, toxic-appearing or diaphoretic.  HENT:     Head: Normocephalic and atraumatic.      Right Ear: External ear normal.     Left Ear: External ear normal.     Nose: Nose normal.     Mouth/Throat:     Mouth: Mucous membranes are moist.     Pharynx: Oropharynx is clear. No oropharyngeal exudate or posterior oropharyngeal erythema.  Eyes:     General: No scleral icterus.       Right eye: No discharge.        Left eye: No discharge.     Extraocular Movements: Extraocular movements intact.     Conjunctiva/sclera: Conjunctivae normal.  Cardiovascular:     Rate and Rhythm: Regular rhythm. Tachycardia present.     Pulses: Normal pulses.     Heart sounds: Normal heart sounds. No murmur heard.   No friction rub. No gallop.  Pulmonary:     Effort: Pulmonary effort is normal. No respiratory distress.     Breath sounds: Normal breath sounds. No stridor. No wheezing, rhonchi or rales.  Abdominal:     General: Abdomen is flat.     Palpations: Abdomen is soft.     Tenderness: There is no abdominal tenderness.     Comments: Appropriately gravid abdomen that is soft and nontender in all 4 quadrants.  Musculoskeletal:        General: Normal range of motion.  Cervical back: Normal range of motion and neck supple. No tenderness.  Skin:    General: Skin is warm and dry.  Neurological:     General: No focal deficit present.     Mental Status: She is alert and oriented to person, place, and time.  Psychiatric:        Mood and Affect: Mood normal.        Behavior: Behavior normal.   ED Results / Procedures / Treatments   Labs (all labs ordered are listed, but only abnormal results are displayed) Labs Reviewed  LIPASE, BLOOD - Abnormal; Notable for the following components:      Result Value   Lipase <10 (*)    All other components within normal limits  COMPREHENSIVE METABOLIC PANEL - Abnormal; Notable for the following components:   CO2 21 (*)    AST 12 (*)    All other components within normal limits  CBC - Abnormal; Notable for the following components:   WBC 10.6 (*)     All other components within normal limits  URINALYSIS, ROUTINE W REFLEX MICROSCOPIC - Abnormal; Notable for the following components:   APPearance HAZY (*)    Specific Gravity, Urine 1.031 (*)    Ketones, ur >80 (*)    Protein, ur 30 (*)    Leukocytes,Ua MODERATE (*)    Bacteria, UA MANY (*)    All other components within normal limits  URINE CULTURE   EKG None  Radiology No results found.  Procedures Procedures   Medications Ordered in ED Medications  fosfomycin (MONUROL) packet 3 g (has no administration in time range)  lactated ringers bolus 1,500 mL (1,500 mLs Intravenous New Bag/Given 11/22/21 2201)  ondansetron (ZOFRAN) injection 4 mg (4 mg Intravenous Given 11/22/21 2159)   ED Course/ Medical Decision Making/ A&P Clinical Course as of 11/22/21 2339  Mon Nov 22, 2021  2146 Bacteria, UA(!): MANY [LJ]  2146 Glori Luis): MODERATE [LJ]  2146 WBC, UA: 6-10 [LJ]    Clinical Course User Index [LJ] Placido Sou, PA-C                           Medical Decision Making Amount and/or Complexity of Data Reviewed Labs: ordered. Decision-making details documented in ED Course.  Risk Prescription drug management.  Pt is a 21 y.o. female who presents to the emergency department due to nausea, vomiting, and diarrhea that started this morning.  Patient is G1 and approximately [redacted] weeks pregnant.  Reports normal fetal movement throughout the day today.  Labs: CBC with a white count of 10.6. CMP with a CO2 of 21 and an AST of 12. Lipase less than 10. UA with greater than 80 urine ketones, elevated specific gravity, 30 protein, moderate leukocytes, 6-10 white blood cells, many bacteria.  I, Placido Sou, PA-C, personally reviewed and evaluated these images and lab results as part of my medical decision-making.  On my exam patient is mildly tachycardic.  No murmurs, rubs, or gallops.  Lungs are clear to auscultation bilaterally.  Appropriately gravid abdomen that is soft  and nontender.  Lab work with abnormalities as noted above.  Mild leukocytosis at 10.6.  Patient currently afebrile.  Given that her abdomen is nontender, doubt infectious etiology at this time.  Lipase less than 10.  Normal kidney function noted on CMP.  No electrolyte derangements.  UA with greater than 80 ketones, 30 protein, moderate leukocytes, 6-10 white blood cells, as well as many  bacteria.  Ketonuria likely secondary to dehydration.  Patient denies any urinary complaints such as dysuria or urinary frequency.  Also denies any vaginal discharge.  Urine culture sent.  Given the patient is pregnant we will treat for asymptomatic bacteriuria.  Patient notes that she has an anaphylactic reaction to penicillin so we will treat with a single dose of fosfomycin.  On reassessment patient is now tolerating p.o. intake without difficulty.  Reports significant improvement in her symptoms after Zofran and IV fluids.  She appears stable for discharge at this time and she is agreeable.  Will discharge on a short course of Zofran for breakthrough nausea and vomiting.  Recommended follow-up with her obstetrician.  Discussed return precautions.  Her questions were answered and she was amicable at the time of discharge.  Note: Portions of this report may have been transcribed using voice recognition software. Every effort was made to ensure accuracy; however, inadvertent computerized transcription errors may be present.   Final Clinical Impression(s) / ED Diagnoses Final diagnoses:  Nausea vomiting and diarrhea  Asymptomatic bacteriuria during pregnancy   Rx / DC Orders ED Discharge Orders          Ordered    ondansetron (ZOFRAN-ODT) 4 MG disintegrating tablet  Every 8 hours PRN        11/22/21 2323              Placido Sou, PA-C 11/22/21 2342    Terald Sleeper, MD 11/23/21 [phone]

## 2021-11-24 LAB — URINE CULTURE: Culture: <10000 — AB

## 2022-01-22 ENCOUNTER — Inpatient Hospital Stay (HOSPITAL_COMMUNITY)
Admission: AD | Admit: 2022-01-22 | Discharge: 2022-01-22 | Disposition: A | Discharge status: Home / Self Care | Payer: Medicaid Other | Attending: Obstetrics & Gynecology | Admitting: Obstetrics & Gynecology

## 2022-01-22 ENCOUNTER — Other Ambulatory Visit: Payer: Self-pay

## 2022-01-22 ENCOUNTER — Encounter (HOSPITAL_COMMUNITY): Payer: Self-pay | Admitting: Obstetrics & Gynecology

## 2022-01-22 DIAGNOSIS — O4703 False labor before 37 completed weeks of gestation, third trimester: Secondary | ICD-10-CM | POA: Diagnosis not present

## 2022-01-22 DIAGNOSIS — O26893 Other specified pregnancy related conditions, third trimester: Secondary | ICD-10-CM | POA: Diagnosis present

## 2022-01-22 DIAGNOSIS — R109 Unspecified abdominal pain: Secondary | ICD-10-CM | POA: Diagnosis not present

## 2022-01-22 DIAGNOSIS — Z3A28 28 weeks gestation of pregnancy: Secondary | ICD-10-CM | POA: Insufficient documentation

## 2022-01-22 LAB — WET PREP, GENITAL
Clue Cells Wet Prep HPF POC: NONE SEEN
Sperm: NONE SEEN
Trich, Wet Prep: NONE SEEN
WBC, Wet Prep HPF POC: >=10 — AB (ref <10)
Yeast Wet Prep HPF POC: NONE SEEN

## 2022-01-22 LAB — URINALYSIS, ROUTINE W REFLEX MICROSCOPIC
Bilirubin Urine: NEGATIVE
Glucose, UA: NEGATIVE mg/dL
Hgb urine dipstick: NEGATIVE
Ketones, ur: 5 mg/dL — AB
Leukocytes,Ua: NEGATIVE
Nitrite: NEGATIVE
Protein, ur: NEGATIVE mg/dL
Specific Gravity, Urine: 1.02 (ref 1.005–1.030)
pH: 6 (ref 5.0–8.0)

## 2022-01-22 MED ORDER — CYCLOBENZAPRINE HCL 5 MG PO TABS
10.0000 mg | ORAL_TABLET | Freq: Once | ORAL | Status: AC
  Administered 2022-01-22: 10 mg via ORAL
  Filled 2022-01-22: qty 2

## 2022-01-22 MED ORDER — TERBUTALINE SULFATE 1 MG/ML IJ SOLN
0.2500 mg | Freq: Once | INTRAMUSCULAR | Status: DC
Start: 2022-01-22 — End: 2022-01-22
  Filled 2022-01-22: qty 1

## 2022-01-22 MED ORDER — NIFEDIPINE 10 MG PO CAPS
10.0000 mg | ORAL_CAPSULE | ORAL | Status: AC | PRN
  Administered 2022-01-22 (×3): 10 mg via ORAL
  Filled 2022-01-22 (×3): qty 1

## 2022-01-22 MED ORDER — NIFEDIPINE 10 MG PO CAPS: 10.0000 mg | ORAL_CAPSULE | Freq: Three times a day (TID) | ORAL | 0 refills | Status: DC | PRN

## 2022-01-22 MED ORDER — LACTATED RINGERS IV BOLUS
1000.0000 mL | Freq: Once | INTRAVENOUS | Status: AC
  Administered 2022-01-22: 1000 mL via INTRAVENOUS

## 2022-01-22 NOTE — Discharge Instructions (Signed)

## 2022-01-22 NOTE — MAU Note (Signed)
Charlene Buckley is a 21 y.o. at [redacted]w[redacted]d here in MAU reporting: cooling burning sensation for the past 2 hours at the bottom of her abdomen. Having some cramping as well. No bleeding or LOF. +FM  Onset of complaint: today  Pain score: 4/10  Vitals:   01/22/22 1447  BP: 109/71  Pulse: 88  Resp: 14  Temp: 98.2 F (36.8 C)  SpO2: 96%     FHT:142  Lab orders placed from triage: UA

## 2022-01-22 NOTE — MAU Provider Note (Signed)
History     CSN: [id number]  Arrival date and time: 01/22/22 1413   Event Date/Time   First Provider Initiated Contact with Patient 01/22/22 1506      Chief Complaint  Patient presents with   Abdominal Pain   HPI  Charlene Buckley is a 21 y.o. G1P0 at [redacted]w[redacted]d who presents for evaluation of abdominal pain. Patient reports "it feels like someone put icy hot on my belly and is blowing on it." She describes it as a "cold burning feeling." She reports she was at training for the [employer] today when it started. She reports she also feels intermittent tightening in her abdomen. Patient rates the pain as a 7/10 and has not tried anything for the pain.  She denies any vaginal bleeding, discharge, and leaking of fluid. Denies any constipation, diarrhea or any urinary complaints. Reports normal fetal movement.   OB History     Gravida  1   Para      Term      Preterm      AB      Living         SAB      IAB      Ectopic      Multiple      Live Births              Past Medical History:  Diagnosis Date   Eczema    GERD (gastroesophageal reflux disease)    Hx of Kawasaki's disease 2010   possible diagnosis   Tendonitis     Past Surgical History:  Procedure Laterality Date   ESOPHAGOSCOPY     TONSILLECTOMY AND ADENOIDECTOMY     TYMPANOSTOMY TUBE PLACEMENT      Family History  Problem Relation Age of Onset   Cancer Mother        cervical   Asthma Mother    Cancer Maternal Aunt        breast   Cancer Maternal Grandmother        cervical   Asthma Maternal Grandmother    Thyroid disease Maternal Grandmother    Heart disease Maternal Grandfather    Alcohol abuse Maternal Grandfather     Social History   Tobacco Use   Smoking status: Never    Passive exposure: Never   Smokeless tobacco: Never  Vaping Use   Vaping Use: Never used  Substance Use Topics   Alcohol use: Never   Drug use: Never    Allergies:  Allergies  Allergen Reactions   Penicillins  Anaphylaxis   Cefuroxime Axetil     Medications Prior to Admission  Medication Sig Dispense Refill Last Dose   Prenatal Vit-Fe Fumarate-FA (PRENATAL MULTIVITAMIN) TABS tablet Take 1 tablet by mouth daily at 12 noon.   01/22/2022   ondansetron (ZOFRAN-ODT) 4 MG disintegrating tablet Take 1 tablet (4 mg total) by mouth every 8 (eight) hours as needed for nausea or vomiting. 8 tablet 0 More than a month    Review of Systems  Constitutional: Negative.  Negative for fatigue and fever.  HENT: Negative.    Respiratory: Negative.  Negative for shortness of breath.   Cardiovascular: Negative.  Negative for chest pain.  Gastrointestinal:  Positive for abdominal pain. Negative for constipation, diarrhea, nausea and vomiting.  Genitourinary: Negative.  Negative for dysuria, vaginal bleeding and vaginal discharge.  Neurological: Negative.  Negative for dizziness and headaches.   Physical Exam   Blood pressure 110/65, pulse (!) 116, temperature 98.2 F (  36.8 C), temperature source Oral, resp. rate 14, height 5\' 3"  (1.6 m), weight 71.1 kg, last menstrual period 07/06/2021, SpO2 96 %.  Patient Vitals for the past 24 hrs:  BP Temp Temp src Pulse Resp SpO2 Height Weight  01/22/22 1721 110/65 -- -- (!) 116 -- -- -- --  01/22/22 1652 114/71 -- -- 99 -- -- -- --  01/22/22 1632 119/84 -- -- -- -- -- -- --  01/22/22 1611 118/73 -- -- -- -- -- -- --  01/22/22 1447 109/71 98.2 F (36.8 C) Oral 88 14 96 % -- --  01/22/22 1442 -- -- -- -- -- -- 5\' 3"  (1.6 m) 71.1 kg    Physical Exam Vitals and nursing note reviewed.  Constitutional:      General: She is not in acute distress.    Appearance: She is well-developed.  HENT:     Head: Normocephalic.  Eyes:     Pupils: Pupils are equal, round, and reactive to light.  Cardiovascular:     Rate and Rhythm: Normal rate and regular rhythm.     Heart sounds: Normal heart sounds.  Pulmonary:     Effort: Pulmonary effort is normal. No respiratory distress.      Breath sounds: Normal breath sounds.  Abdominal:     General: Bowel sounds are normal. There is no distension.     Palpations: Abdomen is soft.     Tenderness: There is no abdominal tenderness.  Skin:    General: Skin is warm and dry.  Neurological:     Mental Status: She is alert and oriented to person, place, and time.  Psychiatric:        Mood and Affect: Mood normal.        Behavior: Behavior normal.        Thought Content: Thought content normal.        Judgment: Judgment normal.     Fetal Tracing:  Baseline: 130 Variability: moderate Accels: 15x15 Decels: none  Toco: 1-7  Dilation: Closed Effacement (%): Thick Exam by:: CV.Kairen Hallinan,CNM  Rechecked after 2 hours and unchanged  MAU Course  Procedures  Results for orders placed or performed during the hospital encounter of 01/22/22 (from the past 24 hour(s))  Wet prep, genital     Status: Abnormal   Collection Time: 01/22/22  3:16 PM   Specimen: Vaginal  Result Value Ref Range   Yeast Wet Prep HPF POC NONE SEEN NONE SEEN   Trich, Wet Prep NONE SEEN NONE SEEN   Clue Cells Wet Prep HPF POC NONE SEEN NONE SEEN   WBC, Wet Prep HPF POC >=10 (A) <10   Sperm NONE SEEN   Urinalysis, Routine w reflex microscopic     Status: Abnormal   Collection Time: 01/22/22  3:18 PM  Result Value Ref Range   Color, Urine YELLOW YELLOW   APPearance HAZY (A) CLEAR   Specific Gravity, Urine 1.020 1.005 - 1.030   pH 6.0 5.0 - 8.0   Glucose, UA NEGATIVE NEGATIVE mg/dL   Hgb urine dipstick NEGATIVE NEGATIVE   Bilirubin Urine NEGATIVE NEGATIVE   Ketones, ur 5 (A) NEGATIVE mg/dL   Protein, ur NEGATIVE NEGATIVE mg/dL   Nitrite NEGATIVE NEGATIVE   Leukocytes,Ua NEGATIVE NEGATIVE     MDM Labs ordered and reviewed.   UA Wet prep and gc/chlamydia LR Bolus Procardia series- contractions spaced significantly, cervix unchanged  Patient reports she is now having more back pain than anything. Reports contractions have spaced.   Flexeril  PO- patient reports resolution of pain  Assessment and Plan   1. Threatened premature labor in third trimester   2. [redacted] weeks gestation of pregnancy     -Discharge home in stable condition -Rx for procardia sent to pharmacy -Preterm labor precautions discussed -Patient advised to follow-up with OB as scheduled for prenatal care -Patient may return to MAU as needed or if her condition were to change or worsen  Rolm Bookbinder, CNM 01/22/2022, 6:28 PM

## 2022-01-23 ENCOUNTER — Inpatient Hospital Stay (HOSPITAL_COMMUNITY)
Admission: AD | Admit: 2022-01-23 | Discharge: 2022-01-23 | Disposition: A | Discharge status: Home / Self Care | Payer: Medicaid Other | Attending: Obstetrics & Gynecology | Admitting: Obstetrics & Gynecology

## 2022-01-23 ENCOUNTER — Other Ambulatory Visit: Payer: Self-pay

## 2022-01-23 ENCOUNTER — Encounter (HOSPITAL_COMMUNITY): Payer: Self-pay | Admitting: Obstetrics & Gynecology

## 2022-01-23 DIAGNOSIS — M549 Dorsalgia, unspecified: Secondary | ICD-10-CM | POA: Diagnosis not present

## 2022-01-23 DIAGNOSIS — O99891 Other specified diseases and conditions complicating pregnancy: Secondary | ICD-10-CM

## 2022-01-23 DIAGNOSIS — O26893 Other specified pregnancy related conditions, third trimester: Secondary | ICD-10-CM | POA: Insufficient documentation

## 2022-01-23 DIAGNOSIS — Z3A28 28 weeks gestation of pregnancy: Secondary | ICD-10-CM

## 2022-01-23 DIAGNOSIS — O4703 False labor before 37 completed weeks of gestation, third trimester: Secondary | ICD-10-CM

## 2022-01-23 LAB — URINALYSIS, ROUTINE W REFLEX MICROSCOPIC
Bilirubin Urine: NEGATIVE
Glucose, UA: NEGATIVE mg/dL
Hgb urine dipstick: NEGATIVE
Ketones, ur: 20 mg/dL — AB
Leukocytes,Ua: NEGATIVE
Nitrite: NEGATIVE
Protein, ur: NEGATIVE mg/dL
Specific Gravity, Urine: 1.014 (ref 1.005–1.030)
pH: 8 (ref 5.0–8.0)

## 2022-01-23 MED ORDER — CYCLOBENZAPRINE HCL 5 MG PO TABS
10.0000 mg | ORAL_TABLET | Freq: Once | ORAL | Status: AC
Start: 2022-01-23 — End: 2022-01-23
  Administered 2022-01-23: 10 mg via ORAL
  Filled 2022-01-23: qty 2

## 2022-01-23 MED ORDER — CYCLOBENZAPRINE HCL 5 MG PO TABS: 5.0000 mg | ORAL_TABLET | Freq: Three times a day (TID) | ORAL | 0 refills | Status: DC | PRN

## 2022-01-23 MED ORDER — NIFEDIPINE 10 MG PO CAPS
10.0000 mg | ORAL_CAPSULE | ORAL | Status: AC | PRN
  Administered 2022-01-23 (×3): 10 mg via ORAL
  Filled 2022-01-23 (×3): qty 1

## 2022-01-23 NOTE — MAU Note (Signed)
Charlene Buckley is a 21 y.o. at [redacted]w[redacted]d here in MAU reporting: contractions started back around 0200 and they every 1.5 min. States she came back because she cannot talk through them. No bleeding or LOF. DFM. Has not picked up procardia.   Onset of complaint: today  Pain score: 6/10  Vitals:   01/23/22 1536  BP: 111/65  Pulse: 86  Resp: 16  Temp: 98 F (36.7 C)  SpO2: 98%     Lab orders placed from triage: UA

## 2022-01-23 NOTE — MAU Provider Note (Signed)
History     [id number]  Arrival date and time: 01/23/22 1526    Chief Complaint  Patient presents with   Contractions     HPI Charlene Buckley is a 21 y.o. at [redacted]w[redacted]d who presents for contractions. Was seen yesterday in MAU for same. States was contracting all night but it worsened this morning around 1130 am. Reports painful contractions every 2 minutes. Was prescribed procardia yesterday but hasn't picked up her rx from the pharmacy yet.  Denies n/v/d, dysuria, vaginal bleeding, or LOF. Good fetal movement.   OB History     Gravida  1   Para      Term      Preterm      AB      Living         SAB      IAB      Ectopic      Multiple      Live Births              Past Medical History:  Diagnosis Date   Eczema    GERD (gastroesophageal reflux disease)    Hx of Kawasaki's disease 2010   possible diagnosis   Tendonitis     Past Surgical History:  Procedure Laterality Date   ESOPHAGOSCOPY     TONSILLECTOMY AND ADENOIDECTOMY     TYMPANOSTOMY TUBE PLACEMENT      Family History  Problem Relation Age of Onset   Cancer Mother        cervical   Asthma Mother    Cancer Maternal Aunt        breast   Cancer Maternal Grandmother        cervical   Asthma Maternal Grandmother    Thyroid disease Maternal Grandmother    Heart disease Maternal Grandfather    Alcohol abuse Maternal Grandfather     Allergies  Allergen Reactions   Penicillins Anaphylaxis   Cefuroxime Axetil     No current facility-administered medications on file prior to encounter.   Current Outpatient Medications on File Prior to Encounter  Medication Sig Dispense Refill   NIFEdipine (PROCARDIA) 10 MG capsule Take 1 capsule (10 mg total) by mouth 3 (three) times daily as needed. 30 capsule 0   ondansetron (ZOFRAN-ODT) 4 MG disintegrating tablet Take 1 tablet (4 mg total) by mouth every 8 (eight) hours as needed for nausea or vomiting. 8 tablet 0   Prenatal Vit-Fe Fumarate-FA (PRENATAL  MULTIVITAMIN) TABS tablet Take 1 tablet by mouth daily at 12 noon.       ROS Pertinent positives and negative per HPI, all others reviewed and negative  Physical Exam   BP 115/75   Pulse 83   Temp 98 F (36.7 C) (Oral)   Resp 16   Ht 5\' 3"  (1.6 m)   Wt 70.9 kg   LMP 07/06/2021   SpO2 99%   BMI 27.69 kg/m   Patient Vitals for the past 24 hrs:  BP Temp Temp src Pulse Resp SpO2 Height Weight  01/23/22 1735 115/75 -- -- 83 -- 99 % -- --  01/23/22 1633 117/77 -- -- 78 -- -- -- --  01/23/22 1536 111/65 98 F (36.7 C) Oral 86 16 98 % -- --  01/23/22 1533 -- -- -- -- -- -- 5\' 3"  (1.6 m) 70.9 kg    Physical Exam Vitals and nursing note reviewed. Exam conducted with a chaperone present.  Constitutional:      General: She is  not in acute distress.    Appearance: Normal appearance.  HENT:     Head: Normocephalic and atraumatic.  Eyes:     General: No scleral icterus.    Conjunctiva/sclera: Conjunctivae normal.  Pulmonary:     Effort: Pulmonary effort is normal. No respiratory distress.  Abdominal:     Palpations: Abdomen is soft.     Tenderness: There is no abdominal tenderness.  Skin:    General: Skin is warm and dry.  Neurological:     Mental Status: She is alert.  Psychiatric:        Mood and Affect: Mood normal.        Behavior: Behavior normal.      Cervical Exam Dilation: Closed Effacement (%): Thick Cervical Position: Posterior Station: -3 Exam by:: Judeth Horn NP   FHT Baseline 140, moderate variability, 15x15 accels, no decels Toco: Q2-6 minutes Cat: 1  Labs Results for orders placed or performed during the hospital encounter of 01/23/22 (from the past 24 hour(s))  Urinalysis, Routine w reflex microscopic Urine, Clean Catch     Status: Abnormal   Collection Time: 01/23/22  3:31 PM  Result Value Ref Range   Color, Urine YELLOW YELLOW   APPearance CLEAR CLEAR   Specific Gravity, Urine 1.014 1.005 - 1.030   pH 8.0 5.0 - 8.0   Glucose, UA  NEGATIVE NEGATIVE mg/dL   Hgb urine dipstick NEGATIVE NEGATIVE   Bilirubin Urine NEGATIVE NEGATIVE   Ketones, ur 20 (A) NEGATIVE mg/dL   Protein, ur NEGATIVE NEGATIVE mg/dL   Nitrite NEGATIVE NEGATIVE   Leukocytes,Ua NEGATIVE NEGATIVE    Imaging No results found.  MAU Course  Procedures Lab Orders         Urinalysis, Routine w reflex microscopic Urine, Clean Catch     Meds ordered this encounter  Medications   NIFEdipine (PROCARDIA) capsule 10 mg   cyclobenzaprine (FLEXERIL) tablet 10 mg   cyclobenzaprine (FLEXERIL) 5 MG tablet    Sig: Take 1 tablet (5 mg total) by mouth 3 (three) times daily as needed (back pain).    Dispense:  20 tablet    Refill:  0    Order Specific Question:   Supervising Provider    Answer:   Jaynie Collins A [[id number]]   Imaging Orders  No imaging studies ordered today    MDM Reviewed prenatals in care everywhere. Patient hasn't been seen for care since [redacted] weeks gestation U/a ordered - results reviewed Treated with procardia & flexeril in MAU-will prescribe flexeril per patient request  Assessment and Plan   1. Preterm uterine contractions in third trimester, antepartum  -Cervix unchanged after nearly 2 hours of monitoring. No change from visit yesterday as well. Reviewed preterm labor precautions & reasons to return to MAU -Take procardia as previously prescribed  2. Back pain affecting pregnancy in third trimester  -Rx flexeril prn  3. [redacted] weeks gestation of pregnancy  -Message sent to [facility] for prenatal care     Judeth Horn, NP 01/23/22 5:47 PM

## 2022-01-24 LAB — GC/CHLAMYDIA PROBE AMP (~~LOC~~) NOT AT ARMC
Chlamydia: NEGATIVE
Comment: NEGATIVE
Comment: NORMAL
Neisseria Gonorrhea: NEGATIVE

## 2022-01-26 DIAGNOSIS — Z3400 Encounter for supervision of normal first pregnancy, unspecified trimester: Secondary | ICD-10-CM | POA: Insufficient documentation

## 2022-01-27 ENCOUNTER — Ambulatory Visit (INDEPENDENT_AMBULATORY_CARE_PROVIDER_SITE_OTHER): Payer: Medicaid Other | Admitting: Advanced Practice Midwife

## 2022-01-27 ENCOUNTER — Encounter: Payer: Self-pay | Admitting: Advanced Practice Midwife

## 2022-01-27 ENCOUNTER — Other Ambulatory Visit: Payer: Medicaid Other

## 2022-01-27 VITALS — BP 94/66 | HR 120 | Wt 158.0 lb

## 2022-01-27 DIAGNOSIS — Z3403 Encounter for supervision of normal first pregnancy, third trimester: Secondary | ICD-10-CM

## 2022-01-27 DIAGNOSIS — Z3A29 29 weeks gestation of pregnancy: Secondary | ICD-10-CM

## 2022-01-27 DIAGNOSIS — Z131 Encounter for screening for diabetes mellitus: Secondary | ICD-10-CM

## 2022-01-27 NOTE — Progress Notes (Signed)
   LOW-RISK PREGNANCY VISIT Patient name: Charlene Buckley MRN [id number]  Date of birth: 2000/09/10 Chief Complaint:   Routine Prenatal Visit (PN2)  History of Present Illness:   Charlene Buckley is a 21 y.o. G1P0 female at 110w2d with an Estimated Date of Delivery: 04/12/22 being seen today for ongoing management of a low-risk pregnancy.  Today she reports no complaints. She fainted in while getting her BP taken, lowered to floor and feet up.  BP low and pulse ^, tried to get BS but machine wasn't working correctly.  Vomited glucola 56 minutes after ingesting.  Now feels fine. Contractions: Not present.  .  Movement: Present. denies leaking of fluid. Review of Systems:   Pertinent items are noted in HPI Denies abnormal vaginal discharge w/ itching/odor/irritation, headaches, visual changes, shortness of breath, chest pain, abdominal pain, severe nausea/vomiting, or problems with urination or bowel movements unless otherwise stated above. Pertinent History Reviewed:  Reviewed past medical,surgical, social, obstetrical and family history.  Reviewed problem list, medications and allergies. Physical Assessment:   Vitals:   01/27/22 0905  BP: 94/66  Pulse: (!) 120  Weight: 158 lb (71.7 kg)  Body mass index is 27.99 kg/m.        Physical Examination:   General appearance: Well appearing, and in no distress  Mental status: Alert, oriented to person, place, and time  Skin: Warm & dry  Cardiovascular: Normal heart rate noted  Respiratory: Normal respiratory effort, no distress  Abdomen: Soft, gravid, nontender  Pelvic: Cervical exam deferred         Extremities: Edema: None  Fetal Status:     Movement: Present    Chaperone: n/a    No results found for this or any previous visit (from the past 24 hour(s)).  Assessment & Plan:  1) Low-risk pregnancy G1P0 at [redacted]w[redacted]d with an Estimated Date of Delivery: 04/12/22     Meds: No orders of the defined types were placed in this  encounter.  Labs/procedures today: PN2  Plan:  Continue routine obstetrical care  Next visit: prefers in person    Reviewed: Preterm labor symptoms and general obstetric precautions including but not limited to vaginal bleeding, contractions, leaking of fluid and fetal movement were reviewed in detail with the patient.  All questions were answered. Has home bp cuf. Check bp weekly, let us know if >140/90.   Follow-up: Return in about 3 weeks (around 02/17/2022) for LROB.  No orders of the defined types were placed in this encounter.  Jacklyn Shell DNP, CNM 01/27/2022 9:37 AM

## 2022-01-27 NOTE — Patient Instructions (Signed)
Charlene Buckley, I greatly value your feedback.  If you receive a survey following your visit with [place] today, we appreciate you taking the time to fill it out.  Thanks, Cathie Beams, DNP, CNM  [facility] HAS MOVED!!! It is now [facility] at [facility] ([address] [place], [place] [address]) Entrance located off of [address] Free 24/7 valet parking   Go to [email or web address] to register for FREE online childbirth classes    Call the office [phone]) or go to [facility] if: You begin to have strong, frequent contractions Your water breaks.  Sometimes it is a big gush of fluid, sometimes it is just a trickle that keeps getting your panties wet or running down your legs You have vaginal bleeding.  It is normal to have a small amount of spotting if your cervix was checked.  You don't feel your baby moving like normal.  If you don't, get you something to eat and drink and lay down and focus on feeling your baby move.  You should feel at least 10 movements in 2 hours.  If you don't, you should call the office or go to [facility].   Home Blood Pressure Monitoring for Patients   Your provider has recommended that you check your blood pressure (BP) at least once a week at home. If you do not have a blood pressure cuff at home, one will be provided for you. Contact your provider if you have not received your monitor within 1 week.   Helpful Tips for Accurate Home Blood Pressure Checks  Don't smoke, exercise, or drink caffeine 30 minutes before checking your BP Use the restroom before checking your BP (a full bladder can raise your pressure) Relax in a comfortable upright chair Feet on the ground Left arm resting comfortably on a flat surface at the level of your heart Legs uncrossed Back supported Sit quietly and don't talk Place the cuff on your bare arm Adjust snuggly, so that only two fingertips can fit between your skin and the top of  the cuff Check 2 readings separated by at least one minute Keep a log of your BP readings For a visual, please reference this diagram: http://ccnc.care/bpdiagram  Provider Name: [facility]     Phone: [phone]  Zone 1: ALL CLEAR  Continue to monitor your symptoms:  BP reading is less than 140 (top number) or less than 90 (bottom number)  No right upper stomach pain No headaches or seeing spots No feeling nauseated or throwing up No swelling in face and hands  Zone 2: CAUTION Call your doctor's office for any of the following:  BP reading is greater than 140 (top number) or greater than 90 (bottom number)  Stomach pain under your ribs in the middle or right side Headaches or seeing spots Feeling nauseated or throwing up Swelling in face and hands  Zone 3: EMERGENCY  Seek immediate medical care if you have any of the following:  BP reading is greater than160 (top number) or greater than 110 (bottom number) Severe headaches not improving with Tylenol Serious difficulty catching your breath Any worsening symptoms from Zone 2

## 2022-01-28 LAB — CBC
Hematocrit: 31.5 % — ABNORMAL LOW (ref 34.0–46.6)
Hemoglobin: 10.2 g/dL — ABNORMAL LOW (ref 11.1–15.9)
MCH: 28.9 pg (ref 26.6–33.0)
MCHC: 32.4 g/dL (ref 31.5–35.7)
MCV: 89 fL (ref 79–97)
Platelets: 330 10*3/uL (ref 150–450)
RBC: 3.53 x10E6/uL — ABNORMAL LOW (ref 3.77–5.28)
RDW: 13.1 % (ref 11.7–15.4)
WBC: 8.2 10*3/uL (ref 3.4–10.8)

## 2022-01-28 LAB — RPR: RPR Ser Ql: NONREACTIVE

## 2022-01-28 LAB — GLUCOSE TOLERANCE, 2 HOURS W/ 1HR
Glucose, 1 hour: 81 mg/dL (ref 70–179)
Glucose, 2 hour: 64 mg/dL — ABNORMAL LOW (ref 70–152)
Glucose, Fasting: 64 mg/dL — ABNORMAL LOW (ref 70–91)

## 2022-01-28 LAB — HIV ANTIBODY (ROUTINE TESTING W REFLEX): HIV Screen 4th Generation wRfx: NONREACTIVE

## 2022-01-28 LAB — ANTIBODY SCREEN: Antibody Screen: NEGATIVE

## 2022-02-17 ENCOUNTER — Ambulatory Visit (INDEPENDENT_AMBULATORY_CARE_PROVIDER_SITE_OTHER): Payer: Medicaid Other | Admitting: Advanced Practice Midwife

## 2022-02-17 VITALS — BP 111/81 | HR 108 | Wt 158.0 lb

## 2022-02-17 DIAGNOSIS — Z3403 Encounter for supervision of normal first pregnancy, third trimester: Secondary | ICD-10-CM

## 2022-02-17 DIAGNOSIS — Z3A32 32 weeks gestation of pregnancy: Secondary | ICD-10-CM

## 2022-02-17 NOTE — Patient Instructions (Signed)
Safe Medications in Pregnancy   Acne: Benzoyl Peroxide Salicylic Acid  Backache/Headache: Tylenol: 2 regular strength every 4 hours OR              2 Extra strength every 6 hours  Colds/Coughs/Allergies: Benadryl (alcohol free) 25 mg every 6 hours as needed Breath right strips Claritin Cepacol throat lozenges Chloraseptic throat spray Cold-Eeze- up to three times per day Cough drops, alcohol free Flonase (by prescription only) Guaifenesin Mucinex Robitussin DM (plain only, alcohol free) Saline nasal spray/drops Sudafed (pseudoephedrine) & Actifed ** use only after [redacted] weeks gestation and if you do not have high blood pressure Tylenol Vicks Vaporub Zinc lozenges Zyrtec   Constipation: Colace Ducolax suppositories Fleet enema Glycerin suppositories Metamucil Milk of magnesia Miralax Senokot Smooth move tea  Diarrhea: Kaopectate Imodium A-D  *NO pepto Bismol  Hemorrhoids: Anusol Anusol HC Preparation H Tucks  Indigestion: Tums Maalox Mylanta Zantac  Pepcid  Insomnia: Benadryl (alcohol free) 25mg  every 6 hours as needed Tylenol PM Unisom, no Gelcaps Melatonin  Leg Cramps: Tums MagGel  Nausea/Vomiting:  Bonine Dramamine Emetrol Ginger extract Sea bands Meclizine  Nausea medication to take during pregnancy:  Unisom (doxylamine succinate 25 mg tablets) Take one tablet daily at bedtime. If symptoms are not adequately controlled, the dose can be increased to a maximum recommended dose of two tablets daily (1/2 tablet in the morning, 1/2 tablet mid-afternoon and one at bedtime). Vitamin B6 100mg  tablets. Take one tablet twice a day (up to 200 mg per day).  Skin Rashes: Aveeno products Benadryl cream or 25mg  every 6 hours as needed Calamine Lotion 1% cortisone cream  Yeast infection: Gyne-lotrimin 7 Monistat 7   **If taking multiple medications, please check labels to avoid duplicating the same active ingredients **take medication as  directed on the label ** Do not exceed 4000 mg of tylenol in 24 hours **Do not take medications that contain aspirin or ibuprofen

## 2022-02-17 NOTE — Progress Notes (Signed)
   LOW-RISK PREGNANCY VISIT Patient name: Charlene Buckley MRN [id number]  Date of birth: 2000-12-04 Chief Complaint:   Routine Prenatal Visit (Still having some contractions)  History of Present Illness:   Charlene Buckley is a 21 y.o. G1P0 female at [redacted]w[redacted]d with an Estimated Date of Delivery: 04/12/22 being seen today for ongoing management of a low-risk pregnancy.  Today she reports  insomnia . Has had some probably hypoglycemic episodes.  Tips given. Contractions: Irregular. Vag. Bleeding: None.  Movement: Present. denies leaking of fluid. Review of Systems:   Pertinent items are noted in HPI Denies abnormal vaginal discharge w/ itching/odor/irritation, headaches, visual changes, shortness of breath, chest pain, abdominal pain, severe nausea/vomiting, or problems with urination or bowel movements unless otherwise stated above. Pertinent History Reviewed:  Reviewed past medical,surgical, social, obstetrical and family history.  Reviewed problem list, medications and allergies. Physical Assessment:   Vitals:   02/17/22 0857  BP: 111/81  Pulse: (!) 108  Weight: 158 lb (71.7 kg)  Body mass index is 27.99 kg/m.        Physical Examination:   General appearance: Well appearing, and in no distress  Mental status: Alert, oriented to person, place, and time  Skin: Warm & dry  Cardiovascular: Normal heart rate noted  Respiratory: Normal respiratory effort, no distress  Abdomen: Soft, gravid, nontender  Pelvic: Cervical exam deferred         Extremities: Edema: None  Fetal Status: Fetal Heart Rate (bpm): 140 Fundal Height: 31 cm Movement: Present    Chaperone: n/a    No results found for this or any previous visit (from the past 24 hour(s)).  Assessment & Plan:  1) Low-risk pregnancy G1P0 at [redacted]w[redacted]d with an Estimated Date of Delivery: 04/12/22      Meds: No orders of the defined types were placed in this encounter.  Labs/procedures today:   Plan:  Continue routine obstetrical care   Next visit: prefers in person    Reviewed: Preterm labor symptoms and general obstetric precautions including but not limited to vaginal bleeding, contractions, leaking of fluid and fetal movement were reviewed in detail with the patient.  All questions were answered. Has home bp cuff. Check bp weekly, let us know if >140/90.   Follow-up: Return in about 2 weeks (around 03/03/2022) for LROB.  No orders of the defined types were placed in this encounter.  Jacklyn Shell DNP, CNM 02/17/2022 9:31 AM

## 2022-02-21 ENCOUNTER — Encounter: Payer: Self-pay | Admitting: Advanced Practice Midwife

## 2022-02-22 ENCOUNTER — Other Ambulatory Visit: Payer: Medicaid Other

## 2022-02-22 DIAGNOSIS — L299 Pruritus, unspecified: Secondary | ICD-10-CM

## 2022-02-22 DIAGNOSIS — Z3A32 32 weeks gestation of pregnancy: Secondary | ICD-10-CM

## 2022-02-24 LAB — CBC
Hematocrit: 30 % — ABNORMAL LOW (ref 34.0–46.6)
Hemoglobin: 10 g/dL — ABNORMAL LOW (ref 11.1–15.9)
MCH: 28 pg (ref 26.6–33.0)
MCHC: 33.3 g/dL (ref 31.5–35.7)
MCV: 84 fL (ref 79–97)
Platelets: 315 10*3/uL (ref 150–450)
RBC: 3.57 x10E6/uL — ABNORMAL LOW (ref 3.77–5.28)
RDW: 12.8 % (ref 11.7–15.4)
WBC: 8.4 10*3/uL (ref 3.4–10.8)

## 2022-02-24 LAB — COMPREHENSIVE METABOLIC PANEL
ALT: 6 IU/L (ref 0–32)
AST: 12 IU/L (ref 0–40)
Albumin/Globulin Ratio: 1.6 (ref 1.2–2.2)
Albumin: 3.6 g/dL — ABNORMAL LOW (ref 4.0–5.0)
Alkaline Phosphatase: 110 IU/L — ABNORMAL HIGH (ref 42–106)
BUN/Creatinine Ratio: 8 — ABNORMAL LOW (ref 9–23)
BUN: 5 mg/dL — ABNORMAL LOW (ref 6–20)
Bilirubin Total: 0.4 mg/dL (ref 0.0–1.2)
CO2: 18 mmol/L — ABNORMAL LOW (ref 20–29)
Calcium: 8.8 mg/dL (ref 8.7–10.2)
Chloride: 104 mmol/L (ref 96–106)
Creatinine, Ser: 0.65 mg/dL (ref 0.57–1.00)
Globulin, Total: 2.2 g/dL (ref 1.5–4.5)
Glucose: 106 mg/dL — ABNORMAL HIGH (ref 70–99)
Potassium: 3.7 mmol/L (ref 3.5–5.2)
Sodium: 139 mmol/L (ref 134–144)
Total Protein: 5.8 g/dL — ABNORMAL LOW (ref 6.0–8.5)
eGFR: 130 mL/min/{1.73_m2} (ref >59)

## 2022-02-24 LAB — BILE ACIDS, TOTAL: Bile Acids Total: 4.5 umol/L (ref 0.0–10.0)

## 2022-03-02 ENCOUNTER — Encounter: Payer: Self-pay | Admitting: Advanced Practice Midwife

## 2022-03-02 ENCOUNTER — Ambulatory Visit (INDEPENDENT_AMBULATORY_CARE_PROVIDER_SITE_OTHER): Payer: Medicaid Other | Admitting: Advanced Practice Midwife

## 2022-03-02 VITALS — BP 117/78 | HR 89 | Wt 163.0 lb

## 2022-03-02 DIAGNOSIS — Z23 Encounter for immunization: Secondary | ICD-10-CM

## 2022-03-02 DIAGNOSIS — Z3A34 34 weeks gestation of pregnancy: Secondary | ICD-10-CM | POA: Diagnosis not present

## 2022-03-02 DIAGNOSIS — Z3403 Encounter for supervision of normal first pregnancy, third trimester: Secondary | ICD-10-CM | POA: Diagnosis not present

## 2022-03-02 NOTE — Progress Notes (Signed)
   LOW-RISK PREGNANCY VISIT Patient name: Charlene Buckley MRN [id number]  Date of birth: [birth year] Chief Complaint:   Routine Prenatal Visit (Painful contractions last night in lower back)  History of Present Illness:   Charlene Buckley is a 21 y.o. G1P0 female at [redacted]w[redacted]d with an Estimated Date of Delivery: 04/12/22 being seen today for ongoing management of a low-risk pregnancy.  Today she reports  has intermittent H/A, sees spots, having LBP; urine 'fizzy' but no dysuria . Contractions: Irregular. Vag. Bleeding: None.  Movement: Present. denies leaking of fluid. Review of Systems:   Pertinent items are noted in HPI Denies abnormal vaginal discharge w/ itching/odor/irritation, headaches, visual changes, shortness of breath, chest pain, abdominal pain, severe nausea/vomiting, or problems with urination or bowel movements unless otherwise stated above. Pertinent History Reviewed:  Reviewed past medical,surgical, social, obstetrical and family history.  Reviewed problem list, medications and allergies. Physical Assessment:   Vitals:   03/02/22 0836  BP: 117/78  Pulse: 89  Weight: 163 lb (73.9 kg)  Body mass index is 28.87 kg/m.        Physical Examination:   General appearance: Well appearing, and in no distress  Mental status: Alert, oriented to person, place, and time  Skin: Warm & dry  Cardiovascular: Normal heart rate noted  Respiratory: Normal respiratory effort, no distress  Abdomen: Soft, gravid, nontender  Pelvic: Cervical exam deferred         Extremities: Edema: None  Fetal Status: Fetal Heart Rate (bpm): 136 Fundal Height: 33 cm Movement: Present    No results found for this or any previous visit (from the past 24 hour(s)).  Assessment & Plan:  1) Low-risk pregnancy G1P0 at [redacted]w[redacted]d with an Estimated Date of Delivery: 04/12/22   2) H/A, hasn't tried Tylenol; tips given  3) GBS pos w severe PCN allergy, collect culture next visit to try to get sensitivities   Meds: No orders  of the defined types were placed in this encounter.  Labs/procedures today: Tdap given  Plan:  Continue routine obstetrical care   Reviewed: Preterm labor symptoms and general obstetric precautions including but not limited to vaginal bleeding, contractions, leaking of fluid and fetal movement were reviewed in detail with the patient.  All questions were answered. Has home bp cuff. Check bp weekly, let us know if >140/90.   Follow-up: Return in about 2 weeks (around 03/16/2022) for LROB, in person; cultures & GBS (for sensitivities).  Orders Placed This Encounter  Procedures   Tdap vaccine greater than or equal to 7yo IM   Arabella Merles [facility] 03/02/2022 8:57 AM

## 2022-03-02 NOTE — Patient Instructions (Signed)
Charlene Buckley, thank you for choosing our office today! We appreciate the opportunity to meet your healthcare needs. You may receive a short survey by mail, e-mail, or through [employer]. If you are happy with your care we would appreciate if you could take just a few minutes to complete the survey questions. We read all of your comments and take your feedback very seriously. Thank you again for choosing our office.  Center for [employer] Team at [facility]  [facility] at [facility] ([address] [place], [place] [address]) Entrance C, located off of [address] Free 24/7 valet parking   CLASSES: Go to [email or web address] to register for classes (childbirth, breastfeeding, waterbirth, infant CPR, daddy bootcamp, etc.)  Call the office [phone]) or go to [facility] if: You begin to have strong, frequent contractions Your water breaks.  Sometimes it is a big gush of fluid, sometimes it is just a trickle that keeps getting your panties wet or running down your legs You have vaginal bleeding.  It is normal to have a small amount of spotting if your cervix was checked.  You don't feel your baby moving like normal.  If you don't, get you something to eat and drink and lay down and focus on feeling your baby move.   If your baby is still not moving like normal, you should call the office or go to [facility].  Call the office [phone]) or go to [facility] hospital for these signs of pre-eclampsia: Severe headache that does not go away with Tylenol Visual changes- seeing spots, double, blurred vision Pain under your right breast or upper abdomen that does not go away with Tums or heartburn medicine Nausea and/or vomiting Severe swelling in your hands, feet, and face   Tdap Vaccine It is recommended that you get the Tdap vaccine during the third trimester of EACH pregnancy to help protect your baby from getting pertussis (whooping cough) 27-36 weeks is the BEST time to do  this so that you can pass the protection on to your baby. During pregnancy is better than after pregnancy, but if you are unable to get it during pregnancy it will be offered at the hospital.  You can get this vaccine with [place], at the health department, your family doctor, or some local pharmacies Everyone who will be around your baby should also be up-to-date on their vaccines before the baby comes. Adults (who are not pregnant) only need 1 dose of Tdap during adulthood.   [facility] Pediatricians/Family Doctors  [facility]): [address] [place], [phone]           [facility]: [address], [phone]                [facility] Medicine [facility]): [address], [phone] (call to ask if accepting patients) [facility] Department: [address], [place], [phone]    [facility] Pediatricians/Family Doctors [facility]): [phone] [address], [phone] [facility]: [address] [place], [phone] [facility]: [address], [phone]  [facility] Doctors  [facility]): [phone] [facility]: [address] [phone]   [facility] Doctors [facility]: [address], [phone]  [facility] Family Doctors  [facility]: [phone], [phone]  Home Blood Pressure Monitoring for Patients   Your provider has recommended that you check your  blood pressure (BP) at least once a week at home. If you do not have a blood pressure cuff at home, one will be provided for you. Contact your provider if you have not received your monitor within 1 week.   Helpful Tips for Accurate Home Blood Pressure Checks  Don't smoke, exercise, or drink caffeine 30 minutes before checking your BP Use the restroom before checking your BP (a full bladder can raise your  pressure) Relax in a comfortable upright chair Feet on the ground Left arm resting comfortably on a flat surface at the level of your heart Legs uncrossed Back supported Sit quietly and don't talk Place the cuff on your bare arm Adjust snuggly, so that only two fingertips can fit between your skin and the top of the cuff Check 2 readings separated by at least one minute Keep a log of your BP readings For a visual, please reference this diagram: http://ccnc.care/bpdiagram  Provider Name: [facility]     Phone: [phone]  Zone 1: ALL CLEAR  Continue to monitor your symptoms:  BP reading is less than 140 (top number) or less than 90 (bottom number)  No right upper stomach pain No headaches or seeing spots No feeling nauseated or throwing up No swelling in face and hands  Zone 2: CAUTION Call your doctor's office for any of the following:  BP reading is greater than 140 (top number) or greater than 90 (bottom number)  Stomach pain under your ribs in the middle or right side Headaches or seeing spots Feeling nauseated or throwing up Swelling in face and hands  Zone 3: EMERGENCY  Seek immediate medical care if you have any of the following:  BP reading is greater than160 (top number) or greater than 110 (bottom number) Severe headaches not improving with Tylenol Serious difficulty catching your breath Any worsening symptoms from Zone 2   Third Trimester of Pregnancy The third trimester is from week 29 through week 42, months 7 through 9. The third trimester is a time when the fetus is growing rapidly. At the end of the ninth month, the fetus is about 20 inches in length and weighs 6-10 pounds.  BODY CHANGES Your body goes through many changes during pregnancy. The changes vary from woman to woman.  Your weight will continue to increase. You can expect to gain 25-35 pounds (11-16 kg) by the end of the pregnancy. You may begin to get stretch marks on your hips, abdomen,  and breasts. You may urinate more often because the fetus is moving lower into your pelvis and pressing on your bladder. You may develop or continue to have heartburn as a result of your pregnancy. You may develop constipation because certain hormones are causing the muscles that push waste through your intestines to slow down. You may develop hemorrhoids or swollen, bulging veins (varicose veins). You may have pelvic pain because of the weight gain and pregnancy hormones relaxing your joints between the bones in your pelvis. Backaches may result from overexertion of the muscles supporting your posture. You may have changes in your hair. These can include thickening of your hair, rapid growth, and changes in texture. Some women also have hair loss during or after pregnancy, or hair that feels dry or thin. Your hair will most likely return to normal after your baby is born. Your breasts will continue to grow and be tender. A yellow discharge may leak from your breasts called colostrum. Your belly button may stick out. You may   feel short of breath because of your expanding uterus. You may notice the fetus "dropping," or moving lower in your abdomen. You may have a bloody mucus discharge. This usually occurs a few days to a week before labor begins. Your cervix becomes thin and soft (effaced) near your due date. WHAT TO EXPECT AT YOUR PRENATAL EXAMS  You will have prenatal exams every 2 weeks until week 36. Then, you will have weekly prenatal exams. During a routine prenatal visit: You will be weighed to make sure you and the fetus are growing normally. Your blood pressure is taken. Your abdomen will be measured to track your baby's growth. The fetal heartbeat will be listened to. Any test results from the previous visit will be discussed. You may have a cervical check near your due date to see if you have effaced. At around 36 weeks, your caregiver will check your cervix. At the same time, your  caregiver will also perform a test on the secretions of the vaginal tissue. This test is to determine if a type of bacteria, Group B streptococcus, is present. Your caregiver will explain this further. Your caregiver may ask you: What your birth plan is. How you are feeling. If you are feeling the baby move. If you have had any abnormal symptoms, such as leaking fluid, bleeding, severe headaches, or abdominal cramping. If you have any questions. Other tests or screenings that may be performed during your third trimester include: Blood tests that check for low iron levels (anemia). Fetal testing to check the health, activity level, and growth of the fetus. Testing is done if you have certain medical conditions or if there are problems during the pregnancy. FALSE LABOR You may feel small, irregular contractions that eventually go away. These are called Braxton Hicks contractions, or false labor. Contractions may last for hours, days, or even weeks before true labor sets in. If contractions come at regular intervals, intensify, or become painful, it is best to be seen by your caregiver.  SIGNS OF LABOR  Menstrual-like cramps. Contractions that are 5 minutes apart or less. Contractions that start on the top of the uterus and spread down to the lower abdomen and back. A sense of increased pelvic pressure or back pain. A watery or bloody mucus discharge that comes from the vagina. If you have any of these signs before the 37th week of pregnancy, call your caregiver right away. You need to go to the hospital to get checked immediately. HOME CARE INSTRUCTIONS  Avoid all smoking, herbs, alcohol, and unprescribed drugs. These chemicals affect the formation and growth of the baby. Follow your caregiver's instructions regarding medicine use. There are medicines that are either safe or unsafe to take during pregnancy. Exercise only as directed by your caregiver. Experiencing uterine cramps is a good sign to  stop exercising. Continue to eat regular, healthy meals. Wear a good support bra for breast tenderness. Do not use hot tubs, steam rooms, or saunas. Wear your seat belt at all times when driving. Avoid raw meat, uncooked cheese, cat litter boxes, and soil used by cats. These carry germs that can cause birth defects in the baby. Take your prenatal vitamins. Try taking a stool softener (if your caregiver approves) if you develop constipation. Eat more high-fiber foods, such as fresh vegetables or fruit and whole grains. Drink plenty of fluids to keep your urine clear or pale yellow. Take warm sitz baths to soothe any pain or discomfort caused by hemorrhoids. Use hemorrhoid cream if   your caregiver approves. If you develop varicose veins, wear support hose. Elevate your feet for 15 minutes, 3-4 times a day. Limit salt in your diet. Avoid heavy lifting, wear low heal shoes, and practice good posture. Rest a lot with your legs elevated if you have leg cramps or low back pain. Visit your dentist if you have not gone during your pregnancy. Use a soft toothbrush to brush your teeth and be gentle when you floss. A sexual relationship may be continued unless your caregiver directs you otherwise. Do not travel far distances unless it is absolutely necessary and only with the approval of your caregiver. Take prenatal classes to understand, practice, and ask questions about the labor and delivery. Make a trial run to the hospital. Pack your hospital bag. Prepare the baby's nursery. Continue to go to all your prenatal visits as directed by your caregiver. SEEK MEDICAL CARE IF: You are unsure if you are in labor or if your water has broken. You have dizziness. You have mild pelvic cramps, pelvic pressure, or nagging pain in your abdominal area. You have persistent nausea, vomiting, or diarrhea. You have a bad smelling vaginal discharge. You have pain with urination. SEEK IMMEDIATE MEDICAL CARE IF:  You  have a fever. You are leaking fluid from your vagina. You have spotting or bleeding from your vagina. You have severe abdominal cramping or pain. You have rapid weight loss or gain. You have shortness of breath with chest pain. You notice sudden or extreme swelling of your face, hands, ankles, feet, or legs. You have not felt your baby move in over an hour. You have severe headaches that do not go away with medicine. You have vision changes. Document Released: 06/14/2001 Document Revised: 06/25/2013 Document Reviewed: 08/21/2012 [facility] Patient Information 2015 [place], [place]. This information is not intended to replace advice given to you by your health care provider. Make sure you discuss any questions you have with your health care provider.

## 2022-03-08 ENCOUNTER — Ambulatory Visit (INDEPENDENT_AMBULATORY_CARE_PROVIDER_SITE_OTHER): Payer: Medicaid Other | Admitting: *Deleted

## 2022-03-08 ENCOUNTER — Telehealth: Payer: Self-pay | Admitting: *Deleted

## 2022-03-08 VITALS — BP 130/85 | HR 119

## 2022-03-08 DIAGNOSIS — Z3A35 35 weeks gestation of pregnancy: Secondary | ICD-10-CM

## 2022-03-08 DIAGNOSIS — Z3403 Encounter for supervision of normal first pregnancy, third trimester: Secondary | ICD-10-CM

## 2022-03-08 LAB — POCT URINALYSIS DIPSTICK OB
Blood, UA: NEGATIVE
Glucose, UA: NEGATIVE
Ketones, UA: NEGATIVE
Nitrite, UA: NEGATIVE

## 2022-03-08 NOTE — Progress Notes (Signed)
   NURSE VISIT- BLOOD PRESSURE CHECK  SUBJECTIVE:  Charlene Buckley is a 21 y.o. G1P0 female here for BP check. She is [redacted]w[redacted]d pregnant    HYPERTENSION ROS:  Pregnant/postpartum:  Severe headaches that don't go away with tylenol/other medicines: Yes  Visual changes (seeing spots/double/blurred vision) No  Severe pain under right breast breast or in center of upper chest Yes  Severe nausea/vomiting Yes  Taking medicines as instructed not applicable   OBJECTIVE:  BP 130/85   Pulse (!) 119   LMP 07/06/2021   Appearance alert, well appearing, and in no distress.  ASSESSMENT: Pregnancy [redacted]w[redacted]d  blood pressure check  PLAN: Discussed with Dr. Alysia Penna   Recommendations:  return on Friday morning for repeat nurse bp check and possible labs if indicated.     Follow-up:  3 days    Annamarie Dawley  03/08/2022 2:05 PM

## 2022-03-08 NOTE — Telephone Encounter (Signed)
Patient called with c/o elevated blood pressures over the last couple of days along with headaches unresolved with Tylenol and some blurred vision.  Advised patient to come to the office this afternoon for a bp check with nurse.  Pt agreeable to plan.

## 2022-03-11 ENCOUNTER — Ambulatory Visit (INDEPENDENT_AMBULATORY_CARE_PROVIDER_SITE_OTHER): Payer: Medicaid Other | Admitting: *Deleted

## 2022-03-11 VITALS — BP 115/81 | HR 101

## 2022-03-11 DIAGNOSIS — H539 Unspecified visual disturbance: Secondary | ICD-10-CM

## 2022-03-11 DIAGNOSIS — Z3403 Encounter for supervision of normal first pregnancy, third trimester: Secondary | ICD-10-CM

## 2022-03-11 LAB — POCT URINALYSIS DIPSTICK OB
Blood, UA: NEGATIVE
Glucose, UA: NEGATIVE
Ketones, UA: NEGATIVE
Nitrite, UA: NEGATIVE

## 2022-03-11 NOTE — Addendum Note (Signed)
Addended by: Moss Mc on: 03/11/2022 10:58 AM   Modules accepted: Orders

## 2022-03-11 NOTE — Progress Notes (Cosign Needed Addendum)
   NURSE VISIT- BLOOD PRESSURE CHECK  SUBJECTIVE:  Charlene Buckley is a 21 y.o. G1P0 female here for BP check. She is [redacted]w[redacted]d pregnant    HYPERTENSION ROS:  Pregnant:  Severe headaches that don't go away with tylenol/other medicines: Yes  Visual changes (seeing spots/double/blurred vision) Yes  Severe pain under right breast breast or in center of upper chest No  Severe nausea/vomiting No  Taking medicines as instructed not applicable   OBJECTIVE:  BP 115/81 (BP Location: Left Arm, Patient Position: Sitting, Cuff Size: Normal)   Pulse (!) 101   LMP 07/06/2021   Appearance alert, well appearing, and in no distress and oriented to person, place, and time.  ASSESSMENT: Pregnancy [redacted]w[redacted]d  blood pressure check  PLAN: Discussed with Dr. Charlotta Newton   Recommendations: check pre-e labs today   Follow-up: as scheduled   Jobe Marker  03/11/2022 10:58 AM

## 2022-03-13 LAB — CBC
Hematocrit: 32 % — ABNORMAL LOW (ref 34.0–46.6)
Hemoglobin: 10 g/dL — ABNORMAL LOW (ref 11.1–15.9)
MCH: 26.1 pg — ABNORMAL LOW (ref 26.6–33.0)
MCHC: 31.3 g/dL — ABNORMAL LOW (ref 31.5–35.7)
MCV: 84 fL (ref 79–97)
Platelets: 368 10*3/uL (ref 150–450)
RBC: 3.83 x10E6/uL (ref 3.77–5.28)
RDW: 13.3 % (ref 11.7–15.4)
WBC: 8.8 10*3/uL (ref 3.4–10.8)

## 2022-03-13 LAB — COMPREHENSIVE METABOLIC PANEL
ALT: 8 IU/L (ref 0–32)
AST: 19 IU/L (ref 0–40)
Albumin/Globulin Ratio: 1.5 (ref 1.2–2.2)
Albumin: 3.9 g/dL — ABNORMAL LOW (ref 4.0–5.0)
Alkaline Phosphatase: 129 IU/L — ABNORMAL HIGH (ref 42–106)
BUN/Creatinine Ratio: 9 (ref 9–23)
BUN: 6 mg/dL (ref 6–20)
Bilirubin Total: 0.5 mg/dL (ref 0.0–1.2)
CO2: 20 mmol/L (ref 20–29)
Calcium: 9.3 mg/dL (ref 8.7–10.2)
Chloride: 101 mmol/L (ref 96–106)
Creatinine, Ser: 0.7 mg/dL (ref 0.57–1.00)
Globulin, Total: 2.6 g/dL (ref 1.5–4.5)
Glucose: 82 mg/dL (ref 70–99)
Potassium: 4.1 mmol/L (ref 3.5–5.2)
Sodium: 137 mmol/L (ref 134–144)
Total Protein: 6.5 g/dL (ref 6.0–8.5)
eGFR: 128 mL/min/{1.73_m2} (ref >59)

## 2022-03-13 LAB — PROTEIN / CREATININE RATIO, URINE
Creatinine, Urine: 234.2 mg/dL
Protein, Ur: 24.6 mg/dL
Protein/Creat Ratio: 105 mg/g creat (ref 0–200)

## 2022-03-16 ENCOUNTER — Ambulatory Visit (INDEPENDENT_AMBULATORY_CARE_PROVIDER_SITE_OTHER): Payer: Medicaid Other | Admitting: Advanced Practice Midwife

## 2022-03-16 ENCOUNTER — Inpatient Hospital Stay (HOSPITAL_COMMUNITY)
Admission: AD | Admit: 2022-03-16 | Discharge: 2022-03-16 | Disposition: A | Discharge status: Home / Self Care | Payer: Medicaid Other | Attending: Obstetrics and Gynecology | Admitting: Obstetrics and Gynecology

## 2022-03-16 ENCOUNTER — Encounter (HOSPITAL_COMMUNITY): Payer: Self-pay | Admitting: Obstetrics and Gynecology

## 2022-03-16 ENCOUNTER — Other Ambulatory Visit (HOSPITAL_COMMUNITY)
Admission: RE | Admit: 2022-03-16 | Discharge: 2022-03-16 | Disposition: A | Discharge status: Home / Self Care | Payer: Medicaid Other | Source: Ambulatory Visit | Attending: Advanced Practice Midwife | Admitting: Advanced Practice Midwife

## 2022-03-16 ENCOUNTER — Encounter: Payer: Self-pay | Admitting: Advanced Practice Midwife

## 2022-03-16 VITALS — BP 125/85 | HR 127 | Wt 170.0 lb

## 2022-03-16 DIAGNOSIS — Z3A36 36 weeks gestation of pregnancy: Secondary | ICD-10-CM

## 2022-03-16 DIAGNOSIS — Z3403 Encounter for supervision of normal first pregnancy, third trimester: Secondary | ICD-10-CM | POA: Diagnosis present

## 2022-03-16 DIAGNOSIS — I10 Essential (primary) hypertension: Secondary | ICD-10-CM | POA: Diagnosis not present

## 2022-03-16 DIAGNOSIS — O26893 Other specified pregnancy related conditions, third trimester: Secondary | ICD-10-CM | POA: Diagnosis not present

## 2022-03-16 DIAGNOSIS — H538 Other visual disturbances: Secondary | ICD-10-CM | POA: Insufficient documentation

## 2022-03-16 DIAGNOSIS — R519 Headache, unspecified: Secondary | ICD-10-CM | POA: Diagnosis not present

## 2022-03-16 DIAGNOSIS — O368390 Maternal care for abnormalities of the fetal heart rate or rhythm, unspecified trimester, not applicable or unspecified: Secondary | ICD-10-CM

## 2022-03-16 DIAGNOSIS — R030 Elevated blood-pressure reading, without diagnosis of hypertension: Secondary | ICD-10-CM | POA: Insufficient documentation

## 2022-03-16 LAB — URINALYSIS, ROUTINE W REFLEX MICROSCOPIC
Bilirubin Urine: NEGATIVE
Glucose, UA: NEGATIVE mg/dL
Hgb urine dipstick: NEGATIVE
Ketones, ur: NEGATIVE mg/dL
Nitrite: NEGATIVE
Protein, ur: 30 mg/dL — AB
Specific Gravity, Urine: 1.024 (ref 1.005–1.030)
pH: 6 (ref 5.0–8.0)

## 2022-03-16 LAB — PROTEIN / CREATININE RATIO, URINE
Creatinine, Urine: 352 mg/dL
Protein Creatinine Ratio: 0.06 mg/mg{Cre} (ref 0.00–0.15)
Total Protein, Urine: 21 mg/dL

## 2022-03-16 LAB — COMPREHENSIVE METABOLIC PANEL
ALT: 10 U/L (ref 0–44)
AST: 18 U/L (ref 15–41)
Albumin: 2.8 g/dL — ABNORMAL LOW (ref 3.5–5.0)
Alkaline Phosphatase: 107 U/L (ref 38–126)
Anion gap: 9 (ref 5–15)
BUN: <5 mg/dL — ABNORMAL LOW (ref 6–20)
CO2: 20 mmol/L — ABNORMAL LOW (ref 22–32)
Calcium: 8.6 mg/dL — ABNORMAL LOW (ref 8.9–10.3)
Chloride: 107 mmol/L (ref 98–111)
Creatinine, Ser: 0.64 mg/dL (ref 0.44–1.00)
GFR, Estimated: >60 mL/min (ref >60)
Glucose, Bld: 89 mg/dL (ref 70–99)
Potassium: 3.7 mmol/L (ref 3.5–5.1)
Sodium: 136 mmol/L (ref 135–145)
Total Bilirubin: 0.6 mg/dL (ref 0.3–1.2)
Total Protein: 5.9 g/dL — ABNORMAL LOW (ref 6.5–8.1)

## 2022-03-16 LAB — CBC
HCT: 28.6 % — ABNORMAL LOW (ref 36.0–46.0)
Hemoglobin: 9.4 g/dL — ABNORMAL LOW (ref 12.0–15.0)
MCH: 26.9 pg (ref 26.0–34.0)
MCHC: 32.9 g/dL (ref 30.0–36.0)
MCV: 81.7 fL (ref 80.0–100.0)
Platelets: 309 10*3/uL (ref 150–400)
RBC: 3.5 MIL/uL — ABNORMAL LOW (ref 3.87–5.11)
RDW: 13.3 % (ref 11.5–15.5)
WBC: 9 10*3/uL (ref 4.0–10.5)
nRBC: 0 % (ref 0.0–0.2)

## 2022-03-16 LAB — OB RESULTS CONSOLE GBS: GBS: NEGATIVE

## 2022-03-16 MED ORDER — METOCLOPRAMIDE HCL 10 MG PO TABS
10.0000 mg | ORAL_TABLET | Freq: Once | ORAL | Status: AC
  Administered 2022-03-16: 10 mg via ORAL
  Filled 2022-03-16: qty 1

## 2022-03-16 MED ORDER — FERROUS FUMARATE 150 MG PO TABS: 1.0000 | ORAL_TABLET | ORAL | 3 refills | Status: DC

## 2022-03-16 MED ORDER — DIPHENHYDRAMINE HCL 25 MG PO CAPS
25.0000 mg | ORAL_CAPSULE | Freq: Once | ORAL | Status: AC
  Administered 2022-03-16: 25 mg via ORAL
  Filled 2022-03-16: qty 1

## 2022-03-16 NOTE — MAU Note (Signed)
B/p low, pt awake and alert, skin w/d. Provider aware. Pt given crackers and gingerale

## 2022-03-16 NOTE — MAU Provider Note (Signed)
Chief Complaint:  Hypertension, Nausea, and Headache   Event Date/Time   First Provider Initiated Contact with Patient 03/16/22 2008      HPI: Charlene Buckley is a 21 y.o. G1P0 at 46w1dwho presents to maternity admissions reporting elevated blood pressure at home.  Had a headache that got better with Tylenol but now is worse. Also has some blurry vision.  . She reports good fetal movement, denies LOF, vaginal bleeding, vaginal itching/burning, urinary symptoms, dizziness, n/v, diarrhea, constipation or fever/chills. .  Hypertension This is a new problem. The current episode started today. The problem is unchanged. Associated symptoms include blurred vision and headaches. Pertinent negatives include no chest pain, palpitations, peripheral edema or shortness of breath. There are no associated agents to hypertension. There are no known risk factors for coronary artery disease. Past treatments include nothing. There are no compliance problems.   Headache  This is a new problem. The current episode started today. The problem has been waxing and waning. The quality of the pain is described as aching. The pain is at a severity of 7/10. Associated symptoms include blurred vision. Pertinent negatives include no back pain, dizziness, fever, muscle aches, nausea, photophobia or vomiting. Nothing aggravates the symptoms. She has tried acetaminophen for the symptoms. The treatment provided mild relief. Her past medical history is significant for hypertension.   RN note: Charlene Buckley is a 21 y.o. at [redacted]w[redacted]d here in MAU reporting: called OB around 1819 for elevated bp 157/100 and HA and blurred vision since 1730. Pt reports she took Tylenol 1000mg  at 1730, and with some relief shortly. Pt states some slight nausea, no episodes of vomiting. Pt states she has had right epigastric pain for a week intermittently that is "sharp and pulling".  Pt states she has experienced s/s for last 2 weeks. Pt denies VB, LOF DFM,  abnormal discharge, and complications in the pregnancy.  Onset of complaint: 1730 Pain score: 7/10 HA   Past Medical History: Past Medical History:  Diagnosis Date   Eczema    GERD (gastroesophageal reflux disease)    Hx of Kawasaki's disease 2010   possible diagnosis   Tendonitis     Past obstetric history: OB History  Gravida Para Term Preterm AB Living  1            SAB IAB Ectopic Multiple Live Births               # Outcome Date GA Lbr Len/2nd Weight Sex Delivery Anes PTL Lv  1 Current             Past Surgical History: Past Surgical History:  Procedure Laterality Date   ESOPHAGOSCOPY     TONSILLECTOMY AND ADENOIDECTOMY     TYMPANOSTOMY TUBE PLACEMENT      Family History: Family History  Problem Relation Age of Onset   Cancer Mother        cervical   Asthma Mother    Cancer Maternal Aunt        breast   Cancer Maternal Grandmother        cervical   Asthma Maternal Grandmother    Thyroid disease Maternal Grandmother    Heart disease Maternal Grandfather    Alcohol abuse Maternal Grandfather     Social History: Social History   Tobacco Use   Smoking status: Never    Passive exposure: Never   Smokeless tobacco: Never  Vaping Use   Vaping Use: Never used  Substance Use Topics  Alcohol use: Never   Drug use: Never    Allergies:  Allergies  Allergen Reactions   Penicillins Anaphylaxis   Cefuroxime Axetil     Meds:  Medications Prior to Admission  Medication Sig Dispense Refill Last Dose   acetaminophen (TYLENOL) 500 MG tablet Take 1,000 mg by mouth every 6 (six) hours as needed for moderate pain.   03/16/2022   Prenatal Vit-Fe Fumarate-FA (PRENATAL MULTIVITAMIN) TABS tablet Take 1 tablet by mouth daily at 12 noon.   03/16/2022   cyclobenzaprine (FLEXERIL) 5 MG tablet Take 1 tablet (5 mg total) by mouth 3 (three) times daily as needed (back pain). (Patient not taking: Reported on 03/08/2022) 20 tablet 0    Ferrous Fumarate 150 MG TABS Take 1  tablet (150 mg total) by mouth every other day. 30 tablet 3    NIFEdipine (PROCARDIA) 10 MG capsule Take 1 capsule (10 mg total) by mouth 3 (three) times daily as needed. (Patient not taking: Reported on 02/17/2022) 30 capsule 0    ondansetron (ZOFRAN-ODT) 4 MG disintegrating tablet Take 1 tablet (4 mg total) by mouth every 8 (eight) hours as needed for nausea or vomiting. (Patient not taking: Reported on 03/16/2022) 8 tablet 0     I have reviewed patient's Past Medical Hx, Surgical Hx, Family Hx, Social Hx, medications and allergies.   ROS:  Review of Systems  Constitutional:  Negative for fever.  Eyes:  Positive for blurred vision. Negative for photophobia.  Respiratory:  Negative for shortness of breath.   Cardiovascular:  Negative for chest pain and palpitations.  Gastrointestinal:  Negative for nausea and vomiting.  Musculoskeletal:  Negative for back pain.  Neurological:  Positive for headaches. Negative for dizziness.   Other systems negative  Physical Exam  Patient Vitals for the past 24 hrs:  BP Temp Temp src Pulse Resp SpO2 Height Weight  03/16/22 2000 -- -- -- -- -- 99 % -- --  03/16/22 1945 107/73 -- -- (!) 128 -- 99 % -- --  03/16/22 1940 -- -- -- -- -- 99 % -- --  03/16/22 1938 113/74 -- -- (!) 107 -- -- -- --  03/16/22 1935 -- -- -- -- -- 100 % -- --  03/16/22 1922 119/76 98.8 F (37.1 C) Oral (!) 114 18 99 % 5\' 3"  (1.6 m) 73.3 kg   Vitals:   03/16/22 2201 03/16/22 2205 03/16/22 2210 03/16/22 2215  BP: 101/64   106/65  Pulse: 74   87  Resp:      Temp:      TempSrc:      SpO2:  100% 99% 99%  Weight:      Height:         Constitutional: Well-developed, well-nourished female in no acute distress.  Cardiovascular: normal rate and rhythm Respiratory: normal effort, clear to auscultation bilaterally GI: Abd soft, non-tender, gravid appropriate for gestational age.   No rebound or guarding. MS: Extremities nontender, no edema, normal ROM Neurologic: Alert and  oriented x 4.   DTRs 2+ with no clonus GU: Neg CVAT.  FHT:  Baseline 135 , moderate variability, accelerations present, no decelerations Contractions: Occasional and Irregular     Labs: Results for orders placed or performed during the hospital encounter of 03/16/22 (from the past 24 hour(s))  Urinalysis, Routine w reflex microscopic     Status: Abnormal   Collection Time: 03/16/22  7:15 PM  Result Value Ref Range   Color, Urine AMBER (A) YELLOW   APPearance CLOUDY (  A) CLEAR   Specific Gravity, Urine 1.024 1.005 - 1.030   pH 6.0 5.0 - 8.0   Glucose, UA NEGATIVE NEGATIVE mg/dL   Hgb urine dipstick NEGATIVE NEGATIVE   Bilirubin Urine NEGATIVE NEGATIVE   Ketones, ur NEGATIVE NEGATIVE mg/dL   Protein, ur 30 (A) NEGATIVE mg/dL   Nitrite NEGATIVE NEGATIVE   Leukocytes,Ua SMALL (A) NEGATIVE   RBC / HPF 0-5 0 - 5 RBC/hpf   WBC, UA 11-20 0 - 5 WBC/hpf   Bacteria, UA FEW (A) NONE SEEN   Squamous Epithelial / LPF 6-10 0 - 5   Mucus PRESENT    Ca Oxalate Crys, UA PRESENT   Protein / creatinine ratio, urine     Status: None   Collection Time: 03/16/22  7:20 PM  Result Value Ref Range   Creatinine, Urine 352 mg/dL   Total Protein, Urine 21 mg/dL   Protein Creatinine Ratio 0.06 0.00 - 0.15 mg/mg[Cre]  CBC     Status: Abnormal   Collection Time: 03/16/22  8:48 PM  Result Value Ref Range   WBC 9.0 4.0 - 10.5 K/uL   RBC 3.50 (L) 3.87 - 5.11 MIL/uL   Hemoglobin 9.4 (L) 12.0 - 15.0 g/dL   HCT 26.3 (L) 78.5 - 88.5 %   MCV 81.7 80.0 - 100.0 fL   MCH 26.9 26.0 - 34.0 pg   MCHC 32.9 30.0 - 36.0 g/dL   RDW 02.7 74.1 - 28.7 %   Platelets 309 150 - 400 K/uL   nRBC 0.0 0.0 - 0.2 %  Comprehensive metabolic panel     Status: Abnormal   Collection Time: 03/16/22  8:48 PM  Result Value Ref Range   Sodium 136 135 - 145 mmol/L   Potassium 3.7 3.5 - 5.1 mmol/L   Chloride 107 98 - 111 mmol/L   CO2 20 (L) 22 - 32 mmol/L   Glucose, Bld 89 70 - 99 mg/dL   BUN <5 (L) 6 - 20 mg/dL   Creatinine, Ser  8.67 0.44 - 1.00 mg/dL   Calcium 8.6 (L) 8.9 - 10.3 mg/dL   Total Protein 5.9 (L) 6.5 - 8.1 g/dL   Albumin 2.8 (L) 3.5 - 5.0 g/dL   AST 18 15 - 41 U/L   ALT 10 0 - 44 U/L   Alkaline Phosphatase 107 38 - 126 U/L   Total Bilirubin 0.6 0.3 - 1.2 mg/dL   GFR, Estimated >67 >20 mL/min   Anion gap 9 5 - 15    --/--/A POS (01/30 2231)  Imaging:  No results found.  MAU Course/MDM: I have reviewed the triage vital signs and the nursing notes.   Pertinent labs & imaging results that were available during my care of the patient were reviewed by me and considered in my medical decision making (see chart for details).      I have reviewed her medical records including past results, notes and treatments.   I have ordered labs and reviewed results. All are normal as are all of her BP readings NST reviewed, reassuring.  Treatments in MAU included EFM, Reglan/Benadryl for headache (resolved headache).    Assessment: Single IUP at [redacted]w[redacted]d Elevated BPs at home, normotensive here Normal labs Headache, resolved with Benadryl and Reglan  Plan: Discharge home Preeclampsia precautions Labor precautions and fetal kick counts Follow up in Office for prenatal visits and recheck of BP Encouraged to return if she develops worsening of symptoms, increase in pain, fever, or other concerning symptoms.   Pt  stable at time of discharge.  Wynelle Bourgeois CNM, MSN Certified Nurse-Midwife 03/16/2022 8:09 PM

## 2022-03-16 NOTE — MAU Note (Addendum)
.  Charlene Buckley is a 21 y.o. at [redacted]w[redacted]d here in MAU reporting: called OB around 1819 for elevated bp 157/100 and HA and blurred vision since 1730. Pt reports she took Tylenol 1000mg  at 1730, and with some relief shortly. Pt states some slight nausea, no episodes of vomiting. Pt states she has had right epigastric pain for a week intermittently that is "sharp and pulling".  Pt states she has experienced s/s for last 2 weeks. Pt denies VB, LOF DFM, abnormal discharge, and complications in the pregnancy.  Onset of complaint: 1730 Pain score: 7/10 HA Vitals:   03/16/22 1922  BP: 119/76  Pulse: (!) 114  Resp: 18  Temp: 98.8 F (37.1 C)  SpO2: 99%     FHT:135 Lab orders placed from triage:  UA

## 2022-03-16 NOTE — Progress Notes (Addendum)
   LOW-RISK PREGNANCY VISIT Patient name: Charlene Buckley MRN [id number]  Date of birth: 23-Apr-2001 Chief Complaint:   Routine Prenatal Visit  History of Present Illness:   Charlene Buckley is a 21 y.o. G1P0 female at [redacted]w[redacted]d with an Estimated Date of Delivery: 04/12/22 being seen today for ongoing management of a low-risk pregnancy.  Today she reports  decreased FM last night, some kicking this morning; feeling some chest discomfort and taking effort to breathe; heart rate alerts from watch 150s at times w walking- settles down to <120 w sitting . Contractions: Irregular. Vag. Bleeding: None.  Movement: (!) Decreased. denies leaking of fluid. Review of Systems:   Pertinent items are noted in HPI Denies abnormal vaginal discharge w/ itching/odor/irritation, headaches, visual changes, shortness of breath, chest pain, abdominal pain, severe nausea/vomiting, or problems with urination or bowel movements unless otherwise stated above. Pertinent History Reviewed:  Reviewed past medical,surgical, social, obstetrical and family history.  Reviewed problem list, medications and allergies. Physical Assessment:   Vitals:   03/16/22 0835  BP: 125/85  Pulse: (!) 127  Weight: 170 lb (77.1 kg)  Body mass index is 30.11 kg/m.        Physical Examination:   General appearance: Well appearing, and in no distress  Mental status: Alert, oriented to person, place, and time  Skin: Warm & dry  Cardiovascular: Normal heart rate noted  Respiratory: Normal respiratory effort, no distress  Abdomen: Soft, gravid, nontender  Pelvic: Cervical exam performed  Dilation: Closed Effacement (%): Thick Station: -3  Extremities: Edema: None  Fetal Status: Fetal Heart Rate (bpm): 145 NST   Movement: (!) Decreased    NST: 120-125 baseline, +accels, no decels, reactive, no ctx  No results found for this or any previous visit (from the past 24 hour(s)).  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    Dispense:  30 tablet    Refill:  3    Order Specific Question:   Supervising Provider    Answer:   Myna Hidalgo [[id number]]   Labs/procedures today: NST  Plan:  Continue routine obstetrical care   Reviewed: Term labor symptoms and general obstetric precautions including but not limited to vaginal bleeding, contractions, leaking of fluid and fetal movement were reviewed in detail with the patient.  All questions were answered. Has home bp cuff. Check bp weekly, let us know if >140/90.   Follow-up: No follow-ups on file.  Orders Placed This Encounter  Procedures   Strep Gp B NAA+Rflx   AMB Referral to Cardio Obstetrics   Arabella Merles [facility] 03/16/2022 9:07 AM

## 2022-03-17 ENCOUNTER — Encounter: Payer: Self-pay | Admitting: Advanced Practice Midwife

## 2022-03-17 LAB — CERVICOVAGINAL ANCILLARY ONLY
Chlamydia: NEGATIVE
Comment: NEGATIVE
Comment: NORMAL
Neisseria Gonorrhea: NEGATIVE

## 2022-03-18 LAB — STREP GP B NAA+RFLX: Strep Gp B NAA+Rflx: NEGATIVE

## 2022-03-23 ENCOUNTER — Encounter: Payer: Self-pay | Admitting: Advanced Practice Midwife

## 2022-03-23 ENCOUNTER — Ambulatory Visit (INDEPENDENT_AMBULATORY_CARE_PROVIDER_SITE_OTHER): Payer: Medicaid Other | Admitting: Advanced Practice Midwife

## 2022-03-23 VITALS — BP 126/88 | HR 118 | Wt 160.0 lb

## 2022-03-23 DIAGNOSIS — O368390 Maternal care for abnormalities of the fetal heart rate or rhythm, unspecified trimester, not applicable or unspecified: Secondary | ICD-10-CM

## 2022-03-23 DIAGNOSIS — Z3A37 37 weeks gestation of pregnancy: Secondary | ICD-10-CM

## 2022-03-23 NOTE — Progress Notes (Signed)
   LOW-RISK PREGNANCY VISIT Patient name: Charlene Buckley MRN [id number]  Date of birth: August 08, 2000 Chief Complaint:   Routine Prenatal Visit  History of Present Illness:   Charlene Buckley is a 21 y.o. G1P0 female at [redacted]w[redacted]d with an Estimated Date of Delivery: 04/12/22 being seen today for ongoing management of a low-risk pregnancy.  Today she reports  episodes of heart beating fast/presyncope; at times it is 150-170 with activity and is better at rest; has cards appt in October; has pressure behind eyes . Contractions: Irregular. Vag. Bleeding: None.  Movement: Present. denies leaking of fluid. Review of Systems:   Pertinent items are noted in HPI Denies abnormal vaginal discharge w/ itching/odor/irritation, headaches, visual changes, shortness of breath, chest pain, abdominal pain, severe nausea/vomiting, or problems with urination or bowel movements unless otherwise stated above. Pertinent History Reviewed:  Reviewed past medical,surgical, social, obstetrical and family history.  Reviewed problem list, medications and allergies. Physical Assessment:   Vitals:   03/23/22 0834  BP: 126/88  Pulse: (!) 118  Weight: 160 lb (72.6 kg)  Body mass index is 28.34 kg/m.        Physical Examination:   General appearance: Well appearing, and in no distress  Mental status: Alert, oriented to person, place, and time  Skin: Warm & dry  Cardiovascular: Normal heart rate noted  Respiratory: Normal respiratory effort, no distress  Abdomen: Soft, gravid, nontender  Pelvic: Cervical exam performed  Dilation: 1 Effacement (%): Thick Station: -3  Extremities: Edema: None  Fetal Status: Fetal Heart Rate (bpm): 159 Fundal Height: 35 cm Movement: Present    No results found for this or any previous visit (from the past 24 hour(s)).  Assessment & Plan:  1) Low-risk pregnancy G1P0 at [redacted]w[redacted]d with an Estimated Date of Delivery: 04/12/22   2) Tachycardia, is making it difficult to be active because of increased  heart rate; has cards appt in October; scheduled for elective IOL @ 39wks per [facility];  IOL form faxed via Epic and orders placed    Meds: No orders of the defined types were placed in this encounter.  Labs/procedures today: SVE  Plan:  Continue routine obstetrical care   Reviewed: Term labor symptoms and general obstetric precautions including but not limited to vaginal bleeding, contractions, leaking of fluid and fetal movement were reviewed in detail with the patient.  All questions were answered. Has home bp cuff.  Check bp weekly, let us know if >140/90.   Follow-up: Return for As scheduled next week (cancel 10/4 and 10/11).  No orders of the defined types were placed in this encounter.  Myrtis Ser CNM 03/23/2022 1:32 PM

## 2022-03-29 ENCOUNTER — Telehealth (HOSPITAL_COMMUNITY): Payer: Self-pay | Admitting: *Deleted

## 2022-03-29 ENCOUNTER — Encounter (HOSPITAL_COMMUNITY): Payer: Self-pay

## 2022-03-29 NOTE — Telephone Encounter (Signed)
Preadmission screen  

## 2022-03-30 ENCOUNTER — Other Ambulatory Visit: Payer: Self-pay | Admitting: Advanced Practice Midwife

## 2022-03-30 ENCOUNTER — Ambulatory Visit (INDEPENDENT_AMBULATORY_CARE_PROVIDER_SITE_OTHER): Payer: Medicaid Other | Admitting: Advanced Practice Midwife

## 2022-03-30 ENCOUNTER — Encounter: Payer: Self-pay | Admitting: Advanced Practice Midwife

## 2022-03-30 ENCOUNTER — Encounter (HOSPITAL_COMMUNITY): Payer: Self-pay | Admitting: *Deleted

## 2022-03-30 ENCOUNTER — Telehealth (HOSPITAL_COMMUNITY): Payer: Self-pay | Admitting: *Deleted

## 2022-03-30 VITALS — BP 107/71 | HR 126 | Wt 161.0 lb

## 2022-03-30 DIAGNOSIS — Z3A38 38 weeks gestation of pregnancy: Secondary | ICD-10-CM

## 2022-03-30 DIAGNOSIS — Z3403 Encounter for supervision of normal first pregnancy, third trimester: Secondary | ICD-10-CM

## 2022-03-30 NOTE — Patient Instructions (Signed)
Try the [email or web address] positions!

## 2022-03-30 NOTE — Telephone Encounter (Signed)
Preadmission screen  

## 2022-03-30 NOTE — Progress Notes (Signed)
   LOW-RISK PREGNANCY VISIT Patient name: Charlene Buckley MRN [id number]  Date of birth: January 24, 2001 Chief Complaint:   Routine Prenatal Visit (Cervix check)  History of Present Illness:   Charlene Buckley is a 21 y.o. G1P0 female at [redacted]w[redacted]d with an Estimated Date of Delivery: 04/12/22 being seen today for ongoing management of a low-risk pregnancy.  Today she reports  keeping her activities down so tachycardia more manageable with no pre-syncopal symptoms . Contractions: Irregular.  .  Movement: Present. denies leaking of fluid. Review of Systems:   Pertinent items are noted in HPI Denies abnormal vaginal discharge w/ itching/odor/irritation, headaches, visual changes, shortness of breath, chest pain, abdominal pain, severe nausea/vomiting, or problems with urination or bowel movements unless otherwise stated above. Pertinent History Reviewed:  Reviewed past medical,surgical, social, obstetrical and family history.  Reviewed problem list, medications and allergies. Physical Assessment:   Vitals:   03/30/22 0836  BP: 107/71  Pulse: (!) 126  Weight: 161 lb (73 kg)  Body mass index is 28.52 kg/m.        Physical Examination:   General appearance: Well appearing, and in no distress  Mental status: Alert, oriented to person, place, and time  Skin: Warm & dry  Cardiovascular: Normal heart rate noted  Respiratory: Normal respiratory effort, no distress  Abdomen: Soft, gravid, nontender  Pelvic: Cervical exam performed  Dilation: 1 Effacement (%): Thick Station: -3  Extremities: Edema: None  Fetal Status: Fetal Heart Rate (bpm): 142 Fundal Height: 36 cm Movement: Present Presentation: Vertex  No results found for this or any previous visit (from the past 24 hour(s)).  Assessment & Plan:  1) Low-risk pregnancy G1P0 at [redacted]w[redacted]d with an Estimated Date of Delivery: 04/12/22   2) Elective IOL due to tachycardia on 10/3 @ [place], this was already scheduled; will come in on 10/2 to have a Racca placement    Meds: No orders of the defined types were placed in this encounter.  Labs/procedures today: SVE  Plan:  Continue routine obstetrical care   Reviewed: Term labor symptoms and general obstetric precautions including but not limited to vaginal bleeding, contractions, leaking of fluid and fetal movement were reviewed in detail with the patient.  All questions were answered. Didn't ask about home bp cuff. Check bp weekly, let us know if >140/90.   Follow-up: Return for 10/2- afternoon LROB w [place] bulb placement/NST.  No orders of the defined types were placed in this encounter.  Myrtis Ser CNM 03/30/2022 9:05 AM

## 2022-04-04 ENCOUNTER — Inpatient Hospital Stay (HOSPITAL_COMMUNITY)
Admission: AD | Admit: 2022-04-04 | Discharge: 2022-04-07 | DRG: 807 | Disposition: A | Discharge status: Home / Self Care | Payer: Medicaid Other | Attending: Obstetrics and Gynecology | Admitting: Obstetrics and Gynecology

## 2022-04-04 ENCOUNTER — Other Ambulatory Visit: Payer: Self-pay

## 2022-04-04 ENCOUNTER — Encounter: Payer: Self-pay | Admitting: Women's Health

## 2022-04-04 ENCOUNTER — Ambulatory Visit (INDEPENDENT_AMBULATORY_CARE_PROVIDER_SITE_OTHER): Payer: Medicaid Other | Admitting: Women's Health

## 2022-04-04 VITALS — BP 123/86 | HR 110 | Wt 160.2 lb

## 2022-04-04 DIAGNOSIS — Z880 Allergy status to penicillin: Secondary | ICD-10-CM | POA: Diagnosis not present

## 2022-04-04 DIAGNOSIS — Z3A39 39 weeks gestation of pregnancy: Secondary | ICD-10-CM | POA: Diagnosis not present

## 2022-04-04 DIAGNOSIS — F419 Anxiety disorder, unspecified: Secondary | ICD-10-CM | POA: Diagnosis present

## 2022-04-04 DIAGNOSIS — R000 Tachycardia, unspecified: Secondary | ICD-10-CM | POA: Diagnosis present

## 2022-04-04 DIAGNOSIS — O26893 Other specified pregnancy related conditions, third trimester: Secondary | ICD-10-CM | POA: Diagnosis present

## 2022-04-04 DIAGNOSIS — Z3403 Encounter for supervision of normal first pregnancy, third trimester: Secondary | ICD-10-CM | POA: Diagnosis not present

## 2022-04-04 DIAGNOSIS — O99892 Other specified diseases and conditions complicating childbirth: Principal | ICD-10-CM | POA: Diagnosis present

## 2022-04-04 DIAGNOSIS — Z3A38 38 weeks gestation of pregnancy: Secondary | ICD-10-CM | POA: Diagnosis not present

## 2022-04-04 DIAGNOSIS — O99824 Streptococcus B carrier state complicating childbirth: Secondary | ICD-10-CM | POA: Diagnosis present

## 2022-04-04 DIAGNOSIS — R8271 Bacteriuria: Secondary | ICD-10-CM | POA: Diagnosis present

## 2022-04-04 DIAGNOSIS — O368390 Maternal care for abnormalities of the fetal heart rate or rhythm, unspecified trimester, not applicable or unspecified: Principal | ICD-10-CM | POA: Diagnosis present

## 2022-04-04 NOTE — Patient Instructions (Signed)
[place], thank you for choosing our office today! We appreciate the opportunity to meet your healthcare needs. You may receive a short survey by mail, e-mail, or through [employer]. If you are happy with your care we would appreciate if you could take just a few minutes to complete the survey questions. We read all of your comments and take your feedback very seriously. Thank you again for choosing our office.  Center for [employer] Team at [place] at [facility] ([place], [place] [address]) Entrance C, located off of [address] parking   CLASSES: Go to [email or web address] to register for classes (childbirth, breastfeeding, waterbirth, infant CPR, daddy bootcamp, etc.)  Call the office [phone]) or go to [facility] if: You begin to have strong, frequent contractions Your water breaks.  Sometimes it is a big gush of fluid, sometimes it is just a trickle that keeps getting your panties wet or running down your legs You have vaginal bleeding.  It is normal to have a small amount of spotting if your cervix was checked.  You don't feel your baby moving like normal.  If you don't, get you something to eat and drink and lay down and focus on feeling your baby move.   If your baby is still not moving like normal, you should call the office or go to [facility].  Call the office [phone]) or go to [facility] hospital for these signs of pre-eclampsia: Severe headache that does not go away with Tylenol Visual changes- seeing spots, double, blurred vision Pain under your right breast or upper abdomen that does not go away with Tums or heartburn medicine Nausea and/or vomiting Severe swelling in your hands, feet, and face   [facility] Pediatricians/Family Doctors [facility]): [address] [place], [place]: [address] [place], [place] [facility]): [place], [phone] (call to ask if accepting patients) [facility] Department: [address], [place], [facility]): [address], [address], [facility]: [address] [place], [facility] [place]: [place], [place]): [phone] [facility]: [address] [place]: [address] [facility]: [phone], [phone]  Home Blood Pressure Monitoring for Patients   Your provider has recommended that you check your blood pressure (BP) at least once a week at home. If you do not have a blood pressure cuff at home, one will be provided for you. Contact your provider if you have not received your monitor within 1 week.   Helpful Tips for Accurate Home Blood Pressure Checks  Don't smoke, exercise, or drink caffeine 30 minutes before checking your BP Use the restroom before checking your BP (a full bladder can raise your pressure) Relax in a comfortable upright chair Feet on the ground Left arm resting comfortably on a flat surface at the level of your heart Legs uncrossed Back supported Sit quietly and don't talk Place the cuff on your bare arm Adjust snuggly, so that only two fingertips  can fit between your skin and the top of the cuff Check 2 readings separated by at least one minute Keep a log of your BP readings For a visual, please reference this diagram: http://ccnc.care/bpdiagram  Provider Name: [facility]     Phone: [phone]  Zone 1: ALL CLEAR  Continue to monitor your symptoms:  BP reading is less than 140 (top number) or less than 90 (bottom number)  No right  upper stomach pain No headaches or seeing spots No feeling nauseated or throwing up No swelling in face and hands  Zone 2: CAUTION Call your doctor's office for any of the following:  BP reading is greater than 140 (top number) or greater than 90 (bottom number)  Stomach pain under your ribs in the middle or right side Headaches or seeing spots Feeling nauseated or throwing up Swelling in face and hands  Zone 3: EMERGENCY  Seek immediate medical care if you have any of the following:  BP reading is greater than160 (top number) or greater than 110 (bottom number) Severe headaches not improving with Tylenol Serious difficulty catching your breath Any worsening symptoms from Zone 2   Braxton Hicks Contractions Contractions of the uterus can occur throughout pregnancy, but they are not always a sign that you are in labor. You may have practice contractions called Braxton Hicks contractions. These false labor contractions are sometimes confused with true labor. What are [place] contractions? Braxton Hicks contractions are tightening movements that occur in the muscles of the uterus before labor. Unlike true labor contractions, these contractions do not result in opening (dilation) and thinning of the cervix. Toward the end of pregnancy (32-34 weeks), Braxton Hicks contractions can happen more often and may become stronger. These contractions are sometimes difficult to tell apart from true labor because they can be very uncomfortable. You should not feel embarrassed if you go to the hospital with false labor. Sometimes, the only way to tell if you are in true labor is for your health care provider to look for changes in the cervix. The health care provider will do a physical exam and may monitor your contractions. If you are not in true labor, the exam should show that your cervix is not dilating and your water has not broken. If there are no other health problems associated with your  pregnancy, it is completely safe for you to be sent home with false labor. You may continue to have Braxton Hicks contractions until you go into true labor. How to tell the difference between true labor and false labor True labor Contractions last 30-70 seconds. Contractions become very regular. Discomfort is usually felt in the top of the uterus, and it spreads to the lower abdomen and low back. Contractions do not go away with walking. Contractions usually become more intense and increase in frequency. The cervix dilates and gets thinner. False labor Contractions are usually shorter and not as strong as true labor contractions. Contractions are usually irregular. Contractions are often felt in the front of the lower abdomen and in the groin. Contractions may go away when you walk around or change positions while lying down. Contractions get weaker and are shorter-lasting as time goes on. The cervix usually does not dilate or become thin. Follow these instructions at home:  Take over-the-counter and prescription medicines only as told by your health care provider. Keep up with your usual exercises and follow other instructions from your health care provider. Eat and drink lightly if you think  you are going into labor. If Braxton Hicks contractions are making you uncomfortable: Change your position from lying down or resting to walking, or change from walking to resting. Sit and rest in a tub of warm water. Drink enough fluid to keep your urine pale yellow. Dehydration may cause these contractions. Do slow and deep breathing several times an hour. Keep all follow-up prenatal visits as told by your health care provider. This is important. Contact a health care provider if: You have a fever. You have continuous pain in your abdomen. Get help right away if: Your contractions become stronger, more regular, and closer together. You have fluid leaking or gushing from your vagina. You pass  blood-tinged mucus (bloody show). You have bleeding from your vagina. You have low back pain that you never had before. You feel your baby's head pushing down and causing pelvic pressure. Your baby is not moving inside you as much as it used to. Summary Contractions that occur before labor are called Braxton Hicks contractions, false labor, or practice contractions. Braxton Hicks contractions are usually shorter, weaker, farther apart, and less regular than true labor contractions. True labor contractions usually become progressively stronger and regular, and they become more frequent. Manage discomfort from [facility] contractions by changing position, resting in a warm bath, drinking plenty of water, or practicing deep breathing. This information is not intended to replace advice given to you by your health care provider. Make sure you discuss any questions you have with your health care provider. Document Revised: 06/02/2017 Document Reviewed: 11/03/2016 Elsevier Patient Education  [place] INDUCTION OF LABOR:  Information Sheet for Mothers and Family               What's a Towson Bulb Induction? A Rowley bulb induction is a procedure where your provider inserts a catheter into your cervix. Once inside your womb, your provider inflates the balloon with a saline solution.   This puts pressure on your cervix and encourages dilation. The catheter falls out once your cervix dilates to 3-4 centimeters.     With any procedure, it's important that you know what to expect. The insertion of a Balbuena catheter can be a bit uncomfortable, and some women experience sharp pelvic pain. The pain may subside once the catheter is in place. You may experience some cramping when the Kloster catheter is in place.  This is normal.     GO TO THE MATERNITY ADMISSIONS UNIT FOR THE FOLLOWING: Heavy vaginal bleeding Rupture of membranes (fluid that wets your underwear) Painful  uterine contractions every 5 minutes or less Severe abdominal discomfort Decreased movement of the baby

## 2022-04-04 NOTE — Progress Notes (Signed)
LOW-RISK PREGNANCY VISIT Patient name: Charlene Buckley MRN [id number]  Date of birth: Sep 19, 2000 Chief Complaint:   Routine Prenatal Visit  History of Present Illness:   Charlene Buckley is a 21 y.o. G1P0 female at [redacted]w[redacted]d with an Estimated Date of Delivery: 04/12/22 being seen today for ongoing management of a low-risk pregnancy.   Today she reports pelvic pressure. Contractions: Irregular. Vag. Bleeding: None.  Movement: Present. denies leaking of fluid.     02/17/2022    9:31 AM 01/27/2022    9:23 AM  Depression screen PHQ 2/9  Decreased Interest 1 0  Down, Depressed, Hopeless 0 0  PHQ - 2 Score 1 0  Altered sleeping 2 2  Tired, decreased energy 1 1  Change in appetite 0 0  Feeling bad or failure about yourself  0 0  Trouble concentrating 0 0  Moving slowly or fidgety/restless 0 0  Suicidal thoughts 0   PHQ-9 Score 4 3        02/17/2022    9:31 AM 01/27/2022    9:24 AM  GAD 7 : Generalized Anxiety Score  Nervous, Anxious, on Edge 0 0  Control/stop worrying 0 0  Worry too much - different things 1 0  Trouble relaxing 0 0  Restless 0 0  Easily annoyed or irritable 1 0  Afraid - awful might happen 1 0  Total GAD 7 Score 3 0      Review of Systems:   Pertinent items are noted in HPI Denies abnormal vaginal discharge w/ itching/odor/irritation, headaches, visual changes, shortness of breath, chest pain, abdominal pain, severe nausea/vomiting, or problems with urination or bowel movements unless otherwise stated above. Pertinent History Reviewed:  Reviewed past medical,surgical, social, obstetrical and family history.  Reviewed problem list, medications and allergies. Physical Assessment:   Vitals:   04/04/22 1339  BP: 123/86  Pulse: (!) 110  Weight: 160 lb 4 oz (72.7 kg)  Body mass index is 28.39 kg/m.        Physical Examination:   General appearance: Well appearing, and in no distress  Mental status: Alert, oriented to person, place, and time  Skin: Warm &  dry  Cardiovascular: Normal heart rate noted  Respiratory: Normal respiratory effort, no distress  Abdomen: Soft, gravid, nontender  Pelvic: Cervical exam performed  Dilation: 1.5 Effacement (%): 50 Station: -3  Extremities: Edema: None  Fetal Status:     Movement: Present Presentation: Vertex  Cervical Aston bulb inserted and inflated w/ 23ml LR w/o difficulty   NST reactive before & after Gall bulb: FHR baseline 130 bpm, Variability: moderate, Accelerations:present, Decelerations:  Absent= Cat 1/reactive Toco: irregular   Chaperone: Calandra Johnson   No results found for this or any previous visit (from the past 24 hour(s)).  Assessment & Plan:  1) Low-risk pregnancy G1P0 at [redacted]w[redacted]d with an Estimated Date of Delivery: 04/12/22   2) Maternal tachycardia> for eIOL tonight @ [facility] as scheduled, Beedle bulb placed- reviewed reasons to seek care prior to IOL   Meds: No orders of the defined types were placed in this encounter.  Labs/procedures today: SVE and Aird bulb insertion  Plan:  eIOL tonight at [facility]  Reviewed: Term labor symptoms and general obstetric precautions including but not limited to vaginal bleeding, contractions, leaking of fluid and fetal movement were reviewed in detail with the patient.  All questions were answered. Does have home bp cuff. Office bp cuff given: not applicable. Check bp daily, let us know if consistently >  140 and/or >90.  Follow-up: No follow-ups on file.  Future Appointments  Date Time Provider [place]  04/05/2022 12:00 AM [facility] SCHED ROOM [facility] None  04/07/2022  1:40 PM Tobb, Kardie, DO [facility] None    No orders of the defined types were placed in this encounter.   [place], [facility] 04/04/2022 2:17 PM

## 2022-04-05 ENCOUNTER — Inpatient Hospital Stay (HOSPITAL_COMMUNITY): Payer: Medicaid Other

## 2022-04-05 ENCOUNTER — Encounter (HOSPITAL_COMMUNITY): Payer: Self-pay | Admitting: Obstetrics & Gynecology

## 2022-04-05 ENCOUNTER — Inpatient Hospital Stay (HOSPITAL_COMMUNITY): Payer: Medicaid Other | Admitting: Anesthesiology

## 2022-04-05 DIAGNOSIS — O99824 Streptococcus B carrier state complicating childbirth: Secondary | ICD-10-CM

## 2022-04-05 DIAGNOSIS — O368390 Maternal care for abnormalities of the fetal heart rate or rhythm, unspecified trimester, not applicable or unspecified: Principal | ICD-10-CM | POA: Diagnosis present

## 2022-04-05 DIAGNOSIS — Z3A39 39 weeks gestation of pregnancy: Secondary | ICD-10-CM

## 2022-04-05 LAB — CBC
HCT: 29 % — ABNORMAL LOW (ref 36.0–46.0)
Hemoglobin: 9.3 g/dL — ABNORMAL LOW (ref 12.0–15.0)
MCH: 25.6 pg — ABNORMAL LOW (ref 26.0–34.0)
MCHC: 32.1 g/dL (ref 30.0–36.0)
MCV: 79.9 fL — ABNORMAL LOW (ref 80.0–100.0)
Platelets: 350 10*3/uL (ref 150–400)
RBC: 3.63 MIL/uL — ABNORMAL LOW (ref 3.87–5.11)
RDW: 14 % (ref 11.5–15.5)
WBC: 11.3 10*3/uL — ABNORMAL HIGH (ref 4.0–10.5)
nRBC: 0 % (ref 0.0–0.2)

## 2022-04-05 LAB — TYPE AND SCREEN
ABO/RH(D): A POS
Antibody Screen: NEGATIVE

## 2022-04-05 LAB — RPR: RPR Ser Ql: NONREACTIVE

## 2022-04-05 MED ORDER — TERBUTALINE SULFATE 1 MG/ML IJ SOLN: 0.2500 mg | Freq: Once | INTRAMUSCULAR | Status: DC | PRN

## 2022-04-05 MED ORDER — BENZOCAINE-MENTHOL 20-0.5 % EX AERO
1.0000 | INHALATION_SPRAY | CUTANEOUS | Status: DC | PRN
  Administered 2022-04-06: 1 via TOPICAL
  Filled 2022-04-05: qty 56

## 2022-04-05 MED ORDER — OXYTOCIN-SODIUM CHLORIDE 30-0.9 UT/500ML-% IV SOLN
2.5000 [IU]/h | INTRAVENOUS | Status: DC
  Administered 2022-04-05: 2.5 [IU]/h via INTRAVENOUS
  Filled 2022-04-05: qty 500

## 2022-04-05 MED ORDER — OXYTOCIN BOLUS FROM INFUSION
333.0000 mL | Freq: Once | INTRAVENOUS | Status: AC
  Administered 2022-04-05: 333 mL via INTRAVENOUS

## 2022-04-05 MED ORDER — OXYCODONE-ACETAMINOPHEN 5-325 MG PO TABS: 2.0000 | ORAL_TABLET | ORAL | Status: DC | PRN

## 2022-04-05 MED ORDER — ACETAMINOPHEN 325 MG PO TABS: 650.0000 mg | ORAL_TABLET | ORAL | Status: DC | PRN

## 2022-04-05 MED ORDER — PRENATAL MULTIVITAMIN CH
1.0000 | ORAL_TABLET | Freq: Every day | ORAL | Status: DC
  Administered 2022-04-06 – 2022-04-07 (×2): 1 via ORAL
  Filled 2022-04-05 (×2): qty 1

## 2022-04-05 MED ORDER — TETANUS-DIPHTH-ACELL PERTUSSIS 5-2.5-18.5 LF-MCG/0.5 IM SUSY: 0.5000 mL | PREFILLED_SYRINGE | Freq: Once | INTRAMUSCULAR | Status: DC

## 2022-04-05 MED ORDER — LIDOCAINE HCL (PF) 1 % IJ SOLN: 30.0000 mL | INTRAMUSCULAR | Status: DC | PRN

## 2022-04-05 MED ORDER — DIPHENHYDRAMINE HCL 50 MG/ML IJ SOLN
12.5000 mg | Freq: Once | INTRAMUSCULAR | Status: AC
  Administered 2022-04-05: 12.5 mg via INTRAVENOUS

## 2022-04-05 MED ORDER — MISOPROSTOL 50MCG HALF TABLET: 50.0000 ug | ORAL_TABLET | Freq: Once | ORAL | Status: DC

## 2022-04-05 MED ORDER — DIPHENHYDRAMINE HCL 50 MG/ML IJ SOLN
INTRAMUSCULAR | Status: AC
  Filled 2022-04-05: qty 1

## 2022-04-05 MED ORDER — ONDANSETRON HCL 4 MG/2ML IJ SOLN: 4.0000 mg | INTRAMUSCULAR | Status: DC | PRN

## 2022-04-05 MED ORDER — DIPHENHYDRAMINE HCL 25 MG PO CAPS: 25.0000 mg | ORAL_CAPSULE | Freq: Four times a day (QID) | ORAL | Status: DC | PRN

## 2022-04-05 MED ORDER — FERROUS FUMARATE 324 (106 FE) MG PO TABS
1.0000 | ORAL_TABLET | ORAL | Status: DC
  Administered 2022-04-06: 106 mg via ORAL
  Filled 2022-04-05: qty 1

## 2022-04-05 MED ORDER — SIMETHICONE 80 MG PO CHEW: 80.0000 mg | CHEWABLE_TABLET | ORAL | Status: DC | PRN

## 2022-04-05 MED ORDER — OXYCODONE-ACETAMINOPHEN 5-325 MG PO TABS: 1.0000 | ORAL_TABLET | ORAL | Status: DC | PRN

## 2022-04-05 MED ORDER — LACTATED RINGERS IV SOLN: INTRAVENOUS | Status: DC

## 2022-04-05 MED ORDER — IBUPROFEN 600 MG PO TABS
600.0000 mg | ORAL_TABLET | Freq: Four times a day (QID) | ORAL | Status: DC
  Administered 2022-04-05 – 2022-04-07 (×7): 600 mg via ORAL
  Filled 2022-04-05 (×7): qty 1

## 2022-04-05 MED ORDER — WITCH HAZEL-GLYCERIN EX PADS: 1.0000 | MEDICATED_PAD | CUTANEOUS | Status: DC | PRN

## 2022-04-05 MED ORDER — FENTANYL CITRATE (PF) 100 MCG/2ML IJ SOLN: 100.0000 ug | INTRAMUSCULAR | Status: DC | PRN

## 2022-04-05 MED ORDER — MISOPROSTOL 25 MCG QUARTER TABLET: 25.0000 ug | ORAL_TABLET | Freq: Once | ORAL | Status: DC

## 2022-04-05 MED ORDER — EPHEDRINE 5 MG/ML INJ: 10.0000 mg | INTRAVENOUS | Status: DC | PRN

## 2022-04-05 MED ORDER — ONDANSETRON HCL 4 MG/2ML IJ SOLN
4.0000 mg | Freq: Four times a day (QID) | INTRAMUSCULAR | Status: DC | PRN
  Administered 2022-04-05: 4 mg via INTRAVENOUS
  Filled 2022-04-05: qty 2

## 2022-04-05 MED ORDER — INFLUENZA VAC SPLIT QUAD 0.5 ML IM SUSY: 0.5000 mL | PREFILLED_SYRINGE | INTRAMUSCULAR | Status: DC

## 2022-04-05 MED ORDER — PHENYLEPHRINE 80 MCG/ML (10ML) SYRINGE FOR IV PUSH (FOR BLOOD PRESSURE SUPPORT)
80.0000 ug | PREFILLED_SYRINGE | INTRAVENOUS | Status: DC | PRN
  Filled 2022-04-05: qty 10

## 2022-04-05 MED ORDER — CEFAZOLIN SODIUM-DEXTROSE 2-4 GM/100ML-% IV SOLN
2.0000 g | Freq: Three times a day (TID) | INTRAVENOUS | Status: DC
  Administered 2022-04-05: 2 g via INTRAVENOUS
  Filled 2022-04-05 (×3): qty 100

## 2022-04-05 MED ORDER — ONDANSETRON HCL 4 MG PO TABS: 4.0000 mg | ORAL_TABLET | ORAL | Status: DC | PRN

## 2022-04-05 MED ORDER — LIDOCAINE HCL (PF) 1 % IJ SOLN
INTRAMUSCULAR | Status: DC | PRN
  Administered 2022-04-05: 5 mL via EPIDURAL

## 2022-04-05 MED ORDER — PRENATAL MULTIVITAMIN CH: 1.0000 | ORAL_TABLET | Freq: Every day | ORAL | Status: DC

## 2022-04-05 MED ORDER — LACTATED RINGERS IV SOLN: 500.0000 mL | INTRAVENOUS | Status: DC | PRN

## 2022-04-05 MED ORDER — ZOLPIDEM TARTRATE 5 MG PO TABS: 5.0000 mg | ORAL_TABLET | Freq: Every evening | ORAL | Status: DC | PRN

## 2022-04-05 MED ORDER — SENNOSIDES-DOCUSATE SODIUM 8.6-50 MG PO TABS
2.0000 | ORAL_TABLET | Freq: Every day | ORAL | Status: DC
  Administered 2022-04-06 – 2022-04-07 (×2): 2 via ORAL
  Filled 2022-04-05 (×2): qty 2

## 2022-04-05 MED ORDER — LACTATED RINGERS IV SOLN
500.0000 mL | Freq: Once | INTRAVENOUS | Status: AC
  Administered 2022-04-05: 500 mL via INTRAVENOUS

## 2022-04-05 MED ORDER — COCONUT OIL OIL: 1.0000 | TOPICAL_OIL | Status: DC | PRN

## 2022-04-05 MED ORDER — OXYTOCIN-SODIUM CHLORIDE 30-0.9 UT/500ML-% IV SOLN: 1.0000 m[IU]/min | INTRAVENOUS | Status: DC

## 2022-04-05 MED ORDER — VANCOMYCIN HCL IN DEXTROSE 1-5 GM/200ML-% IV SOLN
1000.0000 mg | Freq: Two times a day (BID) | INTRAVENOUS | Status: DC
  Administered 2022-04-05: 1000 mg via INTRAVENOUS
  Filled 2022-04-05: qty 200

## 2022-04-05 MED ORDER — FENTANYL-BUPIVACAINE-NACL 0.5-0.125-0.9 MG/250ML-% EP SOLN
12.0000 mL/h | EPIDURAL | Status: DC | PRN
  Filled 2022-04-05: qty 250

## 2022-04-05 MED ORDER — SOD CITRATE-CITRIC ACID 500-334 MG/5ML PO SOLN: 30.0000 mL | ORAL | Status: DC | PRN

## 2022-04-05 MED ORDER — FENTANYL-BUPIVACAINE-NACL 0.5-0.125-0.9 MG/250ML-% EP SOLN
EPIDURAL | Status: DC | PRN
  Administered 2022-04-05: 12 mL/h via EPIDURAL

## 2022-04-05 MED ORDER — PHENYLEPHRINE 80 MCG/ML (10ML) SYRINGE FOR IV PUSH (FOR BLOOD PRESSURE SUPPORT): 80.0000 ug | PREFILLED_SYRINGE | INTRAVENOUS | Status: DC | PRN

## 2022-04-05 MED ORDER — DIBUCAINE (PERIANAL) 1 % EX OINT: 1.0000 | TOPICAL_OINTMENT | CUTANEOUS | Status: DC | PRN

## 2022-04-05 MED ORDER — OXYTOCIN-SODIUM CHLORIDE 30-0.9 UT/500ML-% IV SOLN
1.0000 m[IU]/min | INTRAVENOUS | Status: DC
  Administered 2022-04-05: 2 m[IU]/min via INTRAVENOUS

## 2022-04-05 MED ORDER — DIPHENHYDRAMINE HCL 50 MG/ML IJ SOLN: 12.5000 mg | INTRAMUSCULAR | Status: DC | PRN

## 2022-04-05 NOTE — Progress Notes (Signed)
Labor Progress Note Charlene Buckley is a 21 y.o. G1P0 at [redacted]w[redacted]d presented for eIOL.   S: Starting to feel discomfort with contractions.   O:  BP 116/72   Pulse 99   Temp 98.2 F (36.8 C) (Oral)   Resp 16   LMP 07/06/2021  EFM: 125bpm/moderate/+accels, no decels  CVE: Dilation: 6 Effacement (%): 70 Station: -3 Presentation: Vertex Exam by:: Dr. Janus Molder   A&P: 21 y.o. G1P0 [redacted]w[redacted]d here for eIOL  #Labor: Progressing well. AROM, clear fluid. Contractions spaced out to about every 2-3 mins. Can consider pitocin if unchanged at next exam.  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

## 2022-04-05 NOTE — Progress Notes (Signed)
Patient GBS positive via urine culture 6 months ago. Allergic reaction to PenG. Attempted Vancomycin for GBS Prophylaxis and patient c/o of intense itching over head, chest and arms despite slowing medication down over 2 hours. Resolved with benadryl.   [place] for next dose of antibiotics. Per Pharmacy, as long as reaction to Cefuroxime was minor, may try another cephalosporin like ancef.   Consulted with patient and patient reports hive like itching reaction for Cefuroxime. Denies SOB, wheezing or anaphylaxis reaction with previous use.   Order Changed to Ancef 2g q8hr for GBS Prophylaxis.  Naila Elizondo Isaias Sakai) Rollene Rotunda, MSN, [place] for [place]  04/05/22 1:35 PM

## 2022-04-05 NOTE — Anesthesia Preprocedure Evaluation (Addendum)
Anesthesia Evaluation  Patient identified by MRN, date of birth, ID band Patient awake    Reviewed: Allergy & Precautions, NPO status , Patient's Chart, lab work & pertinent test results  Airway Mallampati: II  TM Distance: >3 FB Neck ROM: Full    Dental no notable dental hx. (+) Teeth Intact, Dental Advisory Given   Pulmonary neg pulmonary ROS,    Pulmonary exam normal breath sounds clear to auscultation       Cardiovascular Exercise Tolerance: Good Normal cardiovascular exam Rhythm:Regular Rate:Normal     Neuro/Psych negative neurological ROS     GI/Hepatic negative GI ROS, Neg liver ROS,   Endo/Other  negative endocrine ROS  Renal/GU negative Renal ROS     Musculoskeletal   Abdominal   Peds  Hematology  (+) Blood dyscrasia, anemia , Lab Results      Component                Value               Date                      WBC                      11.3 (H)            04/05/2022                HGB                      9.3 (L)             04/05/2022                HCT                      29.0 (L)            04/05/2022                MCV                      79.9 (L)            04/05/2022                PLT                      350                 04/05/2022              Anesthesia Other Findings All: PCN, cefuroxime  Reproductive/Obstetrics (+) Pregnancy                            Anesthesia Physical Anesthesia Plan  ASA: 3  Anesthesia Plan: Epidural   Post-op Pain Management:    Induction:   PONV Risk Score and Plan:   Airway Management Planned:   Additional Equipment:   Intra-op Plan:   Post-operative Plan:   Informed Consent: I have reviewed the patients History and Physical, chart, labs and discussed the procedure including the risks, benefits and alternatives for the proposed anesthesia with the patient or authorized representative who has indicated his/her  understanding and acceptance.       Plan Discussed with:   Anesthesia Plan Comments: (89 week primagravida for LEA)  Anesthesia Quick Evaluation  

## 2022-04-05 NOTE — Progress Notes (Addendum)
LABOR PROGRESS NOTE  Charlene Buckley is a 21 y.o. G1P0 at [redacted]w[redacted]d  admitted for IOL s/t maternal tachycardia.   Subjective: Patient is comfortable, sitting in bed. Endorses some itching around abdominal band. Endorses some bloody show and pelvic pressure.   Objective: BP 118/76   Pulse (!) 119   Temp 98.1 F (36.7 C) (Oral)   Resp 16   LMP 07/06/2021   SpO2 100%  or  Vitals:   04/05/22 1330 04/05/22 1400 04/05/22 1430 04/05/22 1500  BP: 130/79 120/78 116/83 118/76  Pulse: (!) 103 86 (!) 102 (!) 119  Resp:      Temp:      TempSrc:      SpO2:        SVE Dilation: 6.5 Effacement (%): 50 Station: -2 Presentation: Vertex Exam by:: Gwendolyn Lima, MD FHT: baseline rate 125, moderate varibility, +acel, -decel Toco: contractions every 1-2 minutes   Labs: Lab Results  Component Value Date   WBC 11.3 (H) 04/05/2022   HGB 9.3 (L) 04/05/2022   HCT 29.0 (L) 04/05/2022   MCV 79.9 (L) 04/05/2022   PLT 350 04/05/2022    Patient Active Problem List   Diagnosis Date Noted   Maternal care for fetal tachycardia during pregnancy 04/05/2022   Supervision of normal first pregnancy 01/26/2022   GBS bacteriuria 08/05/2021   History of vitamin D deficiency 03/08/2018   History of constipation 03/08/2018   Anxiety 11/29/2017   Panic disorder without agoraphobia 07/27/2017    Assessment / Plan: 21 y.o. G1P0 at [redacted]w[redacted]d here for IOL s/t maternal tachycardia. Vaginal exam deferred in attempt to decrease infection. Patient is ruptured and plan of care will not change at this time.   Labor: OP FB, cytotec, IUPC placed @ 1115, pitocin  Fetal Wellbeing:  cat 1  Pain Control:  epidural  Anticipated MOD:  SVD  Lowry Ram, MD  PGY-1, [facility]  04/05/2022, 3:18 PM   I confirm that I have verified and agree with the information documented in the resident's note and mad any editorial changes if necessary.     Deloris Ping, CNM 04/05/2022 4:52 PM

## 2022-04-05 NOTE — Lactation Note (Signed)
This note was copied from a baby's chart. Lactation Consultation Note  Patient Name: Charlene Buckley TRVUY'[id number] Date: 04/05/2022 Reason for consult: L&D Initial assessment;Primapara;1st time breastfeeding;Term (LC L/D visit at 60 mins , baby on the warmer due to a low temperature. LC encouraged LD RN to call when temperature stable and baby going back STS for latch. Parents are aware LC is able to come back to assist and also will F.U on MBU.) Age:20 hours  Maternal Data    Feeding Mother's Current Feeding Choice: Breast Milk   Consult Status Consult Status: Follow-up from L&D Date: 04/05/22 Follow-up type: In-patient    [place] 04/05/2022, 6:45 PM

## 2022-04-05 NOTE — H&P (Signed)
OBSTETRIC ADMISSION HISTORY AND PHYSICAL  Charlene Buckley is a 21 y.o. female G1P0 with IUP at [redacted]w[redacted]d by LMP presenting for eIOL. She reports +FMs, No LOF, no VB, no blurry vision, headaches or peripheral edema, and RUQ pain.  She plans on breast feeding. She request POPs for birth control. She received her prenatal care at  [facility]    Dating: By LMP --->  Estimated Date of Delivery: 04/12/22  Sono:    @[redacted]w[redacted]d , CWD, normal anatomy, cephalic presentation, posterior lie, 323g, 19% EFW   Prenatal History/Complications:  -Tachycardia -Anxiety/Panic disorder -GBS bacteriuria  Past Medical History: Past Medical History:  Diagnosis Date   Eczema    GERD (gastroesophageal reflux disease)    Hx of Kawasaki's disease 2010   possible diagnosis   Tendonitis     Past Surgical History: Past Surgical History:  Procedure Laterality Date   ESOPHAGOSCOPY     TONSILLECTOMY AND ADENOIDECTOMY     TYMPANOSTOMY TUBE PLACEMENT      Obstetrical History: OB History     Gravida  1   Para      Term      Preterm      AB      Living         SAB      IAB      Ectopic      Multiple      Live Births              Social History Social History   Socioeconomic History   Marital status: Single    Spouse name: Not on file   Number of children: Not on file   Years of education: Not on file   Highest education level: Not on file  Occupational History   Not on file  Tobacco Use   Smoking status: Never    Passive exposure: Never   Smokeless tobacco: Never  Vaping Use   Vaping Use: Never used  Substance and Sexual Activity   Alcohol use: Never   Drug use: Never   Sexual activity: Yes    Birth control/protection: None  Other Topics Concern   Not on file  Social History Narrative   Not on file   Social Determinants of Health   Financial Resource Strain: Low Risk  (02/17/2022)   Overall Financial Resource Strain (CARDIA)    Difficulty of Paying Living Expenses: Not hard  at all  Food Insecurity: No Food Insecurity (02/17/2022)   Hunger Vital Sign    Worried About Running Out of Food in the Last Year: Never true    Ran Out of Food in the Last Year: Never true  Transportation Needs: No Transportation Needs (02/17/2022)   PRAPARE - 02/19/2022 (Medical): No    Lack of Transportation (Non-Medical): No  Physical Activity: Insufficiently Active (02/17/2022)   Exercise Vital Sign    Days of Exercise per Week: 2 days    Minutes of Exercise per Session: 10 min  Stress: No Stress Concern Present (02/17/2022)   02/19/2022 of Occupational Health - Occupational Stress Questionnaire    Feeling of Stress : Only a little  Social Connections: Moderately Isolated (02/17/2022)   Social Connection and Isolation Panel [NHANES]    Frequency of Communication with Friends and Family: Once a week    Frequency of Social Gatherings with Friends and Family: Once a week    Attends Religious Services: 1 to 4 times per year    Active  Member of Clubs or Organizations: No    Attends Music therapist: Never    Marital Status: Living with partner    Family History: Family History  Problem Relation Age of Onset   Cancer Mother        cervical   Asthma Mother    Cancer Maternal Aunt        breast   Cancer Maternal Grandmother        cervical   Asthma Maternal Grandmother    Thyroid disease Maternal Grandmother    Heart disease Maternal Grandfather    Alcohol abuse Maternal Grandfather     Allergies: Allergies  Allergen Reactions   Penicillins Anaphylaxis   Cefuroxime Axetil     Medications Prior to Admission  Medication Sig Dispense Refill Last Dose   acetaminophen (TYLENOL) 500 MG tablet Take 1,000 mg by mouth every 6 (six) hours as needed for moderate pain. (Patient not taking: Reported on 03/30/2022)      cyclobenzaprine (FLEXERIL) 5 MG tablet Take 1 tablet (5 mg total) by mouth 3 (three) times daily as needed (back pain).  (Patient not taking: Reported on 03/08/2022) 20 tablet 0    Ferrous Fumarate 150 MG TABS Take 1 tablet (150 mg total) by mouth every other day. 30 tablet 3    ondansetron (ZOFRAN-ODT) 4 MG disintegrating tablet Take 1 tablet (4 mg total) by mouth every 8 (eight) hours as needed for nausea or vomiting. (Patient not taking: Reported on 03/16/2022) 8 tablet 0    Prenatal Vit-Fe Fumarate-FA (PRENATAL MULTIVITAMIN) TABS tablet Take 1 tablet by mouth daily at 12 noon.        Review of Systems   All systems reviewed and negative except as stated in HPI  Blood pressure 113/76, pulse (!) 102, temperature 98.4 F (36.9 C), temperature source Oral, last menstrual period 07/06/2021. General appearance: alert and no distress Lungs: normal effort Heart: regular rate noted in room. Abdomen: gravid Pelvic: 5/70/-3 Extremities: No LE edema Presentation: cephalic Fetal monitoringBaseline: 145 bpm, Variability: Good {> 6 bpm), Accelerations: Reactive, and Decelerations: Absent Uterine activityFrequency: Every 1-2 minutes     Prenatal labs: ABO, Rh: --/--/A POS (01/30 2231) Antibody: Negative (07/27 0832) Rubella:  Immune 09/01/21 RPR: Non Reactive (07/27 0832)  HBsAg:   Neg 09/03/21 HIV: Non Reactive (07/27 9326)  GBS: --[place] (09/13 1330) +bacteruria  1 hr Glucola 81 Genetic screening  wnl Anatomy US wnl, female  Prenatal Transfer Tool  Maternal Diabetes: No Genetic Screening: Normal Maternal Ultrasounds/Referrals: Normal Fetal Ultrasounds or other Referrals:  None Maternal Substance Abuse:  No Significant Maternal Medications:  None Significant Maternal Lab Results:  Group B Strep positive Number of Prenatal Visits:greater than 3 verified prenatal visits Other Comments:  None  No results found for this or any previous visit (from the past 24 hour(s)).  Patient Active Problem List   Diagnosis Date Noted   Supervision of normal first pregnancy 01/26/2022   GBS bacteriuria 08/05/2021    History of vitamin D deficiency 03/08/2018   History of constipation 03/08/2018   Anxiety 11/29/2017   Panic disorder without agoraphobia 07/27/2017    Assessment/Plan:  Charlene Buckley is a 21 y.o. G1P0 at [redacted]w[redacted]d here for eIOL 2/2 maternal tachycardia.   #Labor: Outpatient [place] placed 10/2. Out now 5cm. Significantly contracting, will hold off on pitocin for now. Can start if contractions space out. Consider AROM.  #Pain: Maternally supported, planning for epidural #FWB: Cat I #ID:  GBS pos, Vanc per pharmacy 2/2  PCN anaphylaxis  #MOF: Breast #MOC: POPs #Circ:  Yes  Maternal tachycardia, Anxiety/panic disorder Normal HR on monitor. Mood appropriate at this time.   Kristen Fromm Autry-Lott, DO  04/05/2022, 12:03 AM

## 2022-04-05 NOTE — Lactation Note (Signed)
This note was copied from a baby's chart. Lactation Consultation Note  Patient Name: Charlene Buckley GYFVC'B Date: 04/05/2022 Reason for consult: Initial assessment;1st time breastfeeding;Term Age:21 hours Per Birth Parent infant recently BF for 8 minutes at 2040 pm, LC did not observe latch and Birth Parent was doing STS with infant. Birth Parent understands to continue to BF infant according to hunger cues, on demand, 8 to 12+ times within 24 hours, STS. LC discussed hand expression with breast model and Birth Parent self expressed but grimace, LC reviewed hand expression.  Birth Parent knows to call RN/LC if their are any BF questions, concerns or need assistance with latching infant at the breast. Mom made aware of O/P services, breastfeeding support groups, community resources, and our phone # for post-discharge questions.   Maternal Data Has patient been taught Hand Expression?: Yes Does the patient have breastfeeding experience prior to this delivery?: No  Feeding Mother's Current Feeding Choice: Breast Milk  LATCH Score Latch: Repeated attempts needed to sustain latch, nipple held in mouth throughout feeding, stimulation needed to elicit sucking reflex.  Audible Swallowing: A few with stimulation  Type of Nipple: Everted at rest and after stimulation  Comfort (Breast/Nipple): Soft / non-tender  Hold (Positioning): Assistance needed to correctly position infant at breast and maintain latch.  LATCH Score: 7   Lactation Tools Discussed/Used    Interventions Interventions: Breast feeding basics reviewed;Skin to skin;Hand express;Education;LC Services brochure  Discharge    Consult Status Consult Status: Follow-up Date: 04/06/22 Follow-up type: In-patient    Vicente Serene 04/05/2022, 9:09 PM

## 2022-04-05 NOTE — Anesthesia Procedure Notes (Signed)
Epidural Patient location during procedure: OB Start time: 04/05/2022 6:40 AM End time: 04/05/2022 6:56 AM  Staffing Anesthesiologist: Barnet Glasgow, MD Performed: anesthesiologist   Preanesthetic Checklist Completed: patient identified, IV checked, site marked, risks and benefits discussed, surgical consent, monitors and equipment checked, pre-op evaluation and timeout performed  Epidural Patient position: sitting Prep: DuraPrep and site prepped and draped Patient monitoring: continuous pulse ox and blood pressure Approach: midline Location: L3-L4 Injection technique: LOR air  Needle:  Needle type: Tuohy  Needle gauge: 17 G Needle length: 9 cm and 9 Needle insertion depth: 7 cm Catheter type: closed end flexible Catheter size: 19 Gauge Catheter at skin depth: 12 cm Test dose: negative  Assessment Events: blood not aspirated, injection not painful, no injection resistance, no paresthesia and negative IV test  Additional Notes Patient identified. Risks/Benefits/Options discussed with patient including but not limited to bleeding, infection, nerve damage, paralysis, failed block, incomplete pain control, headache, blood pressure changes, nausea, vomiting, reactions to medication both or allergic, itching and postpartum back pain. Confirmed with bedside nurse the patient's most recent platelet count. Confirmed with patient that they are not currently taking any anticoagulation, have any bleeding history or any family history of bleeding disorders. Patient expressed understanding and wished to proceed. All questions were answered. Sterile technique was used throughout the entire procedure. Please see nursing notes for vital signs. Test dose was given through epidural needle and negative prior to continuing to dose epidural or start infusion. Warning signs of high block given to the patient including shortness of breath, tingling/numbness in hands, complete motor block, or any  concerning symptoms with instructions to call for help. Patient was given instructions on fall risk and not to get out of bed. All questions and concerns addressed with instructions to call with any issues.  1 Attempt (S) . Patient tolerated procedure well.

## 2022-04-05 NOTE — Progress Notes (Signed)
Dr. Janus Molder notified right before pushing benadryl for scalp burning and itching, pt c/o intense itching of chest, upper abdomen and arms. Vancomycin stopped 23ml left in bag. Dr. Janus Molder stated will notify pharmacy

## 2022-04-05 NOTE — Discharge Summary (Signed)
Postpartum Discharge Summary  Date of Service updated***     Patient Name: Charlene Buckley DOB: January 26, 2001 MRN: [id number]  Date of admission: 04/04/2022 Delivery date:04/05/2022  Delivering provider: Stormy Card  Date of discharge: 04/05/2022  Admitting diagnosis: Maternal care for fetal tachycardia during pregnancy [O36.8390] Intrauterine pregnancy: [redacted]w[redacted]d    Secondary diagnosis:  Principal Problem:   Maternal care for fetal tachycardia during pregnancy Active Problems:   GBS bacteriuria   Anxiety   Vaginal delivery  Additional problems: none    Discharge diagnosis: Term Pregnancy Delivered                                              Post partum procedures:{Postpartum procedures:23558} Augmentation: AROM, Pitocin, and OP Stary, cytotec Complications: None  Hospital course: Induction of Labor With Vaginal Delivery   21y.o. yo G1P0 at 359w0das admitted to the hospital 04/04/2022 for induction of labor.  Indication for induction: Elective.  Patient had an uncomplicated labor course as follows: Membrane Rupture Time/Date: 5:07 AM ,04/05/2022   Delivery Method:Vaginal, Spontaneous  Episiotomy: None  Lacerations:  None  Details of delivery can be found in separate delivery note.  Patient had a routine postpartum course. Patient is discharged home 04/05/22.  Newborn Data: Birth date:04/05/2022  Birth time:5:35 PM  Gender:Female  Living status:Living  Apgars:8 ,9  Weight:   Magnesium Sulfate received: No BMZ received: No Rhophylac:N/A MMR:N/A. Rubella Immune T-DaP:Given prenatally Flu: {F{RRN:16579}ransfusion:{Transfusion received:30440034}  Physical exam  Vitals:   04/05/22 1700 04/05/22 1745 04/05/22 1800 04/05/22 1815  BP: 114/71 129/74 (!) 104/38 111/73  Pulse: 100 (!) 102  86  Resp:      Temp:      TempSrc:      SpO2:       General: {Exam; general:21111117} Lochia: {Desc; appropriate/inappropriate:30686::"appropriate"} Uterine Fundus: {Desc;  firm/soft:30687} Incision: {Exam; incision:21111123} DVT Evaluation: {Exam; dvt:2111122} Labs: Lab Results  Component Value Date   WBC 11.3 (H) 04/05/2022   HGB 9.3 (L) 04/05/2022   HCT 29.0 (L) 04/05/2022   MCV 79.9 (L) 04/05/2022   PLT 350 04/05/2022      Latest Ref Rng & Units 03/16/2022    8:48 PM  CMP  Glucose 70 - 99 mg/dL 89   BUN 6 - 20 mg/dL <5   Creatinine 0.44 - 1.00 mg/dL 0.64   Sodium 135 - 145 mmol/L 136   Potassium 3.5 - 5.1 mmol/L 3.7   Chloride 98 - 111 mmol/L 107   CO2 22 - 32 mmol/L 20   Calcium 8.9 - 10.3 mg/dL 8.6   Total Protein 6.5 - 8.1 g/dL 5.9   Total Bilirubin 0.3 - 1.2 mg/dL 0.6   Alkaline Phos 38 - 126 U/L 107   AST 15 - 41 U/L 18   ALT 0 - 44 U/L 10    Edinburgh Score:     No data to display           After visit meds:  Allergies as of 04/05/2022       Reactions   Penicillins Anaphylaxis   Cefuroxime Axetil      Med Rec [facility] be completed [facility]        Discharge home in stable condition Infant Feeding: {Baby feeding:23562} Infant Disposition:{CHL IP OB HOME WITH MOUXYBFX:83291}ischarge instruction: per After Visit Summary  and Postpartum booklet. Activity: Advance as tolerated. Pelvic rest for 6 weeks.  Diet: {OB OOJZ:53010404} Future Appointments: Future Appointments  Date Time Provider [place]  04/07/2022  1:40 PM Tobb, Godfrey Pick, DO [facility] None   Follow up Visit:  The following message was sent to [facility] by Mikki Santee, MD  Please schedule this patient for a In person postpartum visit in 6 weeks with the following provider: Any provider. Additional Postpartum F/U:Postpartum Depression checkup  Low risk pregnancy complicated by:  none Delivery mode:  Vaginal, Spontaneous  Anticipated Birth Control:  POPs   04/05/2022 Chiagoziem Sherrilyn Rist, MD

## 2022-04-05 NOTE — Progress Notes (Addendum)
LABOR PROGRESS NOTE  Charlene Buckley is a 21 y.o. G1P0 at [redacted]w[redacted]d admitted for IOL s/t maternal tachycardia   Subjective: Patient is comfortable with epidural. Says that she feels some pressure from (urinary) [place], but otherwise well.   Objective: BP 115/73   Pulse 96   Temp 98.1 F (36.7 C) (Oral)   Resp 16   LMP 07/06/2021   SpO2 100%  or  Vitals:   04/05/22 0930 04/05/22 1000 04/05/22 1030 04/05/22 1100  BP: 111/71 114/72 112/86 115/73  Pulse: 77 76 (!) 116 96  Resp: 16     Temp:  98.1 F (36.7 C)    TempSrc:  Oral    SpO2: 100%       SVE Dilation: 6.5cm  Effacement: 50  Station -2  Exam by: ALowry RamMD (PGY-1)  FHT: baseline rate 130, moderate varibility, +acel Toco: 70-80 sec contraction q 2-3 min   Labs: Lab Results  Component Value Date   WBC 11.3 (H) 04/05/2022   HGB 9.3 (L) 04/05/2022   HCT 29.0 (L) 04/05/2022   MCV 79.9 (L) 04/05/2022   PLT 350 04/05/2022    Patient Active Problem List   Diagnosis Date Noted   Maternal care for fetal tachycardia during pregnancy 04/05/2022   Supervision of normal first pregnancy 01/26/2022   GBS bacteriuria 08/05/2021   History of vitamin D deficiency 03/08/2018   History of constipation 03/08/2018   Anxiety 11/29/2017   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 G1P0 at 351w0dere for IOL s/t maternal tachycardia. Pregnancy complicated by GBS +, anxiety.   Labor: OP FB, po & vaginal cytotec, IUPC placed @ 1115 attempted posteriorly, met resistance, repositioned to 8'oclock position and was able to advance without difficulty. Will start pitocin and titrate accordingly.  Fetal Wellbeing:  Cat I  Pain Control:  Epidural  Anticipated MOD:  SVD  AkLowry RamMD  PGY-1, [facility]  04/05/2022, 11:23 AM    CNM Entry:  I confirm that I have verified and agree with the information documented in the resident's note, and made any necessary editorial changes.   I was  present for SVE and placement of IUPC. Clear fluid flash back in IUPC cath. Tested with patient cough.   - Labor: PiScientific laboratory technicianContinue to titrate Pit as needed.  - Fetal well being: FHT Cat 1 upon placement. 130bpm with moderate variability and no decels noted. Toco q 2-3 mins. IUPC placed and assessing  - Pain Control: Epidural placed and working well.  - MOD: NSVD   PaDeloris PingCNM 04/05/2022 11:56 AM

## 2022-04-05 NOTE — Progress Notes (Signed)
Charlene Buckley is a 21 y.o. G1P0 at [redacted]w[redacted]d by LMP admitted for induction of labor due to maternal tachycardia.  Subjective: Patient just awoke from nap, but is very comfortable with epidural.   Objective: BP 113/76   Pulse 94   Temp 98.1 F (36.7 C) (Oral)   Resp 16   LMP 07/06/2021   SpO2 100%  No intake/output data recorded. Total I/O In: -  Out: 150 [Urine:150]  FHT:  FHR: 130 bpm, variability: moderate,  accelerations:  Present,  decelerations:  Absent UC:   regular, every  minutes SVE:   Dilation: 8 Effacement (%): 80 Station: Plus 1 Exam by:: Huntley Dec CNM MVU's 160-270 adequate   Labs: Lab Results  Component Value Date   WBC 11.3 (H) 04/05/2022   HGB 9.3 (L) 04/05/2022   HCT 29.0 (L) 04/05/2022   MCV 79.9 (L) 04/05/2022   PLT 350 04/05/2022   Patient Vitals for the past 24 hrs:  BP Temp Temp src Pulse Resp SpO2  04/05/22 1600 113/76 -- -- 94 -- --  04/05/22 1530 126/80 -- -- 94 -- --  04/05/22 1500 118/76 -- -- (!) 119 -- --  04/05/22 1430 116/83 -- -- (!) 102 -- --  04/05/22 1400 120/78 -- -- 86 -- --  04/05/22 1330 130/79 -- -- (!) 103 -- --  04/05/22 1300 121/72 -- -- (!) 102 -- --  04/05/22 1230 120/78 -- -- 94 -- --  04/05/22 1200 122/76 -- -- 95 -- --  04/05/22 1130 105/66 -- -- 92 -- --  04/05/22 1100 115/73 -- -- 96 -- --  04/05/22 1030 112/86 -- -- (!) 116 -- --  04/05/22 1000 114/72 98.1 F (36.7 C) Oral 76 -- --  04/05/22 0930 111/71 -- -- 77 16 100 %  04/05/22 0900 110/75 -- -- 87 18 99 %  04/05/22 0830 101/68 -- -- (!) 113 18 100 %  04/05/22 0800 128/81 -- -- (!) 105 18 98 %  04/05/22 0750 113/75 -- -- 96 18 100 %  04/05/22 0730 112/80 -- -- 93 18 98 %  04/05/22 0720 112/70 98.3 F (36.8 C) Oral 89 18 95 %  04/05/22 0715 93/61 -- -- (!) 126 -- 99 %  04/05/22 0710 96/64 -- -- (!) 116 -- 98 %  04/05/22 0705 102/69 -- -- (!) 104 -- 99 %  04/05/22 0700 103/67 -- -- 99 -- 97 %  04/05/22 0656 121/73 -- -- (!) 107 -- 99 %  04/05/22 0652 116/75  -- -- (!) 116 -- --  04/05/22 0503 116/72 -- -- 99 -- --  04/05/22 0400 112/76 98.2 F (36.8 C) Oral 86 16 --  04/05/22 0249 119/77 -- -- 88 -- --  04/05/22 0204 106/73 -- -- 83 18 --  04/05/22 0157 93/60 -- -- 82 18 --  04/05/22 0106 122/81 -- -- 83 -- --  04/05/22 0029 113/76 98.4 F (36.9 C) Oral (!) 102 -- --     Assessment / Plan: Induction of labor due to maternal tachycardia ,  progressing well on pitocin s/p AROM and FB   Labor: Progressing on Pitocin, continue to titrate as needed.  Fetal Wellbeing:  Category I- continue to monitor for signs of fetal distress.  Pain Control:  Epidural I/D:   GBS positive being treated with IV Ancef q8 adequately treated  Anticipated MOD:  NSVD  [name], CNM 04/05/2022, 4:33 PM

## 2022-04-05 NOTE — Progress Notes (Signed)
Charlene Buckley is a 21 y.o. G1P0 at [redacted]w[redacted]d by LMP admitted for induction of labor due to maternal tachycardia .  Subjective: Patient doing well, and comfortable with epidural. Denies feeling pressure. Introductions exchanged.   Objective: BP 101/68   Pulse (!) 113   Temp 98.3 F (36.8 C) (Oral)   Resp 18   LMP 07/06/2021   SpO2 98%  No intake/output data recorded. No intake/output data recorded.  FHT:  FHR: 130 bpm, variability: moderate,  accelerations:  Present,  decelerations:  Absent UC:   regular, every 2-3 minutes SVE:   Dilation: 5 Effacement (%): 50 Station: -3 Exam by:: Chilton Greathouse, RN  Labs: Lab Results  Component Value Date   WBC 11.3 (H) 04/05/2022   HGB 9.3 (L) 04/05/2022   HCT 29.0 (L) 04/05/2022   MCV 79.9 (L) 04/05/2022   PLT 350 04/05/2022    Assessment / Plan: Induction of labor due to maternal tachycardia , status post Outpatient [place] balloon and AROM at 0507.   Labor:  Contractions q2-3 mins, with minimal cervical change status post AROM. Plan to reassess in 4 hours from last exam, Possibly introduce IUPC and/or pit if needed.    Fetal Wellbeing:  Category I- continue to monitor for signs of fetal distress.  Pain Control:  Epidural I/D:   GBS Positive  in urine in early pregnancy tx with Vanc d/t PenG Allergy. Small allergic reaction to Vancy in intrapartum. Treated with benadryl .  Anticipated MOD:  NSVD  [name], CNM 04/05/2022, 9:03 AM

## 2022-04-06 ENCOUNTER — Encounter: Payer: Medicaid Other | Admitting: Advanced Practice Midwife

## 2022-04-06 LAB — CBC WITH DIFFERENTIAL/PLATELET
Abs Immature Granulocytes: 0.05 10*3/uL (ref 0.00–0.07)
Basophils Absolute: 0.1 10*3/uL (ref 0.0–0.1)
Basophils Relative: 0 %
Eosinophils Absolute: 0.1 10*3/uL (ref 0.0–0.5)
Eosinophils Relative: 1 %
HCT: 25.3 % — ABNORMAL LOW (ref 36.0–46.0)
Hemoglobin: 8.1 g/dL — ABNORMAL LOW (ref 12.0–15.0)
Immature Granulocytes: 0 %
Lymphocytes Relative: 19 %
Lymphs Abs: 2.2 10*3/uL (ref 0.7–4.0)
MCH: 25.9 pg — ABNORMAL LOW (ref 26.0–34.0)
MCHC: 32 g/dL (ref 30.0–36.0)
MCV: 80.8 fL (ref 80.0–100.0)
Monocytes Absolute: 0.8 10*3/uL (ref 0.1–1.0)
Monocytes Relative: 7 %
Neutro Abs: 8.1 10*3/uL — ABNORMAL HIGH (ref 1.7–7.7)
Neutrophils Relative %: 73 %
Platelets: 228 10*3/uL (ref 150–400)
RBC: 3.13 MIL/uL — ABNORMAL LOW (ref 3.87–5.11)
RDW: 14.5 % (ref 11.5–15.5)
WBC: 11.3 10*3/uL — ABNORMAL HIGH (ref 4.0–10.5)
nRBC: 0 % (ref 0.0–0.2)

## 2022-04-06 LAB — GLUCOSE, CAPILLARY: Glucose-Capillary: 70 mg/dL (ref 70–99)

## 2022-04-06 MED ORDER — FERROUS SULFATE 325 (65 FE) MG PO TABS
325.0000 mg | ORAL_TABLET | ORAL | Status: DC
  Administered 2022-04-06: 325 mg via ORAL
  Filled 2022-04-06: qty 1

## 2022-04-06 NOTE — Progress Notes (Signed)
Post Partum Day 1 Subjective: Eating, drinking, voiding, ambulating well.  +flatus.  Lochia and pain wnl.  Denies dizziness, lightheadedness, or sob. No complaints.  Objective: Blood pressure 107/71, pulse 63, temperature 97.9 F (36.6 C), temperature source Oral, resp. rate 18, last menstrual period 07/06/2021, SpO2 99 %, unknown if currently breastfeeding.  Physical Exam:  General: alert, cooperative, and no distress Lochia: appropriate Uterine Fundus: firm Incision: n/a DVT Evaluation: No evidence of DVT seen on physical exam. Negative Homan's sign. No cords or calf tenderness. No significant calf/ankle edema.  Recent Labs    04/05/22 0018  HGB 9.3*  HCT 29.0*    Assessment/Plan: Plan for discharge tomorrow, Breastfeeding, Lactation consult, and Contraception POPs Po Fe   LOS: 2 days   Roma Schanz, CNM 04/06/2022, 8:38 AM   CNM Circumcision Counseling Progress Note  Patient desires circumcision for her female infant.  Circumcision procedure details discussed, risks and benefits of procedure were also discussed.  These include but are not limited to: Benefits of circumcision in men include reduction in the rates of urinary tract infection (UTI), penile cancer, some sexually transmitted infections, penile inflammatory and retractile disorders, as well as easier hygiene.  Risks include bleeding , infection, injury of glans which may lead to penile deformity or urinary tract issues, unsatisfactory cosmetic appearance and other potential complications related to the procedure.  It was emphasized that this is an elective procedure.  Patient wants to proceed with circumcision; written informed consent will be obtained.  Will have MD do circumcision when infant is cleared for such by peds.  Knute Neu, CNM, [facility] 04/06/2022   8:39 AM

## 2022-04-06 NOTE — Lactation Note (Addendum)
This note was copied from a baby's chart. Lactation Consultation Note  Patient Name: Boy Darneshia Demary HENID'P Date: 04/06/2022 Reason for consult: Mother's request;Term;[place] Age:20 hours  Mom with previous nipple piercings (one piercing hole appears patent on L breast). 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   Mom reports that infant isn't latching/looks "disgusted" when trying to latch. I reassured Mom. Hand expression was done with Mom & spoon-fed to infant.   Prior to spoon-feeding, I noted a color change in baby's face (likely positional from the way support person was holding infant). Infant's face was immediately pink with position change. Infant's body was pink, respirations were normal, and there was no increased WOB noted. Instruction was given to Dad. RN & NP made aware of the above.  Specifics of an asymmetric latch were shown via [employer].    Feeding Mother's Current Feeding Choice: Breast Milk   Interventions Interventions: Education;Hand express    Consult Status Consult Status: Follow-up Date: 04/07/22 Follow-up type: In-patient    [facility] 04/06/2022, 1:49 PM

## 2022-04-06 NOTE — Social Work (Signed)
MOB was referred for history of depression/anxiety.   * Referral screened out by Clinical Social Worker because none of the following criteria appear to apply:   ~ History of anxiety/depression during this pregnancy, or of post-partum depression following prior delivery.   ~ Diagnosis of anxiety and/or depression within last 3 years OR * MOB's symptoms currently being treated with medication and/or therapy.   Per chart review no noted symptoms during pregnancy. MOB was diagnosed with Anxiety and Panic Disorder prior to October 2020.    Please contact the Clinical Social Worker if needs arise or by MOB request.   Jemuel Laursen, LCSWA Clinical Social Worker [phone] 

## 2022-04-06 NOTE — Progress Notes (Signed)
Patient called out with complaint of dizziness and feeling very hot. Went to bedside for assessment, patient in bed semi-fowlers. VSS, with normal heart rate (hx of tachycardia and syncope). Fundal assessment WNL, scant bleeding. Reports no SOB, chest pain. Reports last meal was this morning at 0900 and feels it's related to her blood sugar.   Dr. Elvina Mattes notified. POCT cbg and CBC. Encouraged meals and hydration. Juice also given and they have ordered a meal from the cafeteria.  Patient reports feeling a little better after this encounter.

## 2022-04-06 NOTE — Anesthesia Postprocedure Evaluation (Signed)
Anesthesia Post Note  Patient: Charlene Buckley  Procedure(s) Performed: AN AD HOC LABOR EPIDURAL     Patient location during evaluation: Mother Baby Anesthesia Type: Epidural Level of consciousness: awake and alert Pain management: pain level controlled Vital Signs Assessment: post-procedure vital signs reviewed and stable Respiratory status: spontaneous breathing, nonlabored ventilation and respiratory function stable Cardiovascular status: stable Postop Assessment: no headache, no backache and epidural receding Anesthetic complications: no   No notable events documented.  Last Vitals:  Vitals:   04/06/22 0527 04/06/22 0735  BP: 106/70 107/71  Pulse: 68 63  Resp: 16 18  Temp: 36.7 C 36.6 C  SpO2: 100% 99%    Last Pain:  Vitals:   04/06/22 0735  TempSrc: Oral  PainSc: 2    Pain Goal:                   [name]

## 2022-04-07 ENCOUNTER — Ambulatory Visit: Payer: Medicaid Other | Attending: Cardiology | Admitting: Cardiology

## 2022-04-07 MED ORDER — SENNOSIDES-DOCUSATE SODIUM 8.6-50 MG PO TABS: 2.0000 | ORAL_TABLET | Freq: Every day | ORAL | 0 refills | Status: AC

## 2022-04-07 MED ORDER — SLYND 4 MG PO TABS: 1.0000 | ORAL_TABLET | Freq: Every day | ORAL | 12 refills | Status: AC

## 2022-04-07 MED ORDER — IBUPROFEN 600 MG PO TABS: 600.0000 mg | ORAL_TABLET | Freq: Four times a day (QID) | ORAL | 0 refills | Status: AC

## 2022-04-07 MED ORDER — ACETAMINOPHEN 325 MG PO TABS: 650.0000 mg | ORAL_TABLET | ORAL | 0 refills | Status: AC | PRN

## 2022-04-07 MED ORDER — BENZOCAINE-MENTHOL 20-0.5 % EX AERO: 1.0000 | INHALATION_SPRAY | CUTANEOUS | 0 refills | Status: AC | PRN

## 2022-04-07 MED ORDER — FERROUS SULFATE 325 (65 FE) MG PO TABS: 325.0000 mg | ORAL_TABLET | ORAL | 3 refills | Status: AC

## 2022-04-07 NOTE — Lactation Note (Signed)
This note was copied from a baby's chart. Lactation Consultation Note  Patient Name: Charlene Buckley [id number] Date: 04/07/2022 Reason for consult: Follow-up assessment;Difficult latch;Term Age:20 hours  Parent states baby is slipping off with each latch attempt.  Would like to DC today.  Circ at 230 today.   Baby mouth is very tight.  Suck assessment: and baby was gumming finger.    Placed baby STS, hand express.  Lc assisted with latching.  After several attempts, baby began to latch and multiple swallowing heard.  Parent's nipple was compressed after feeding.    LC assisted with relatch.  Latching is difficult and after several attempts, lc assisted again.  BAby latched easier this time and fed again for a total of 20 minutes.    NP at bedside.  Delayed circ and DC to work on feedings.  Parent very happy baby is latching.Baby satisfied after feeding.  Plan:  STS, feed back any pumped milk to baby. 43ml collected on bedside.   Feed with cues.  F/U with [facility]/  message sent.  Maternal Data    Feeding Mother's Current Feeding Choice: Breast Milk  LATCH Score Latch: Repeated attempts needed to sustain latch, nipple held in mouth throughout feeding, stimulation needed to elicit sucking reflex.  Audible Swallowing: Spontaneous and intermittent  Type of Nipple: Everted at rest and after stimulation  Comfort (Breast/Nipple): Filling, red/small blisters or bruises, mild/mod discomfort  Hold (Positioning): Assistance needed to correctly position infant at breast and maintain latch.  LATCH Score: 7   Lactation Tools Discussed/Used    Interventions Interventions: Breast feeding basics reviewed;Assisted with latch;Skin to skin;Breast massage;Hand express;Adjust position;Support pillows;Position options  Discharge    Consult Status Consult Status: Follow-up Date: 04/08/22 Follow-up type: In-patient    Ferne Coe [facility] 04/07/2022, 2:44 PM

## 2022-04-07 NOTE — Lactation Note (Signed)
This note was copied from a baby's chart. Lactation Consultation Note  Patient Name: Boy Zahriah Roes [id number] Date: 04/07/2022 Reason for consult: Follow-up assessment;Mother's request;Difficult latch;1st time breastfeeding;Term Age:20 hours P1, term female infant with -1% weight loss. Per Birth Parent infant has not been latching well at the breast, she has been spoon feeding infant 2 to 3 mls of colostrum per feeding. LC assisted Birth Parent will pre-pumping breast with hand pump prior to latching infant at the breast see nipple type below. Birth Parent latched infant on her left breast using the cross cradle hold, infant was on and off breast for 11 minutes. Birth Parent will continue working on latching infant at the breast and will ask RN/LC for further latch assistance if needed. Afterwards Birth Parent used hand pump with 21 breast flange and expressed 13 mls of colostrum that was feed to infant using curve tip syringe on LC's gloved finger. Breast Parent was doing STS with infant when Tatum left the room. Birth Parent feeding plan: 1- Pre-pump with hand pump prior to latching infant, continue to BF by cues on demand, 8 to 12 times, skin to skin and for latch assistance if needed by RN/LC. 2- If infant is still cuing after latching at the 1st breast offer the 2nd breast within the same feeding. 3-Birth Parent can continue to use hand pump after latching infant at the breast and give any EBM by  curve tip syringe, Altier cup or slow flow bottle nipple.   Maternal Data    Feeding Mother's Current Feeding Choice: Breast Milk  LATCH Score Latch: Repeated attempts needed to sustain latch, nipple held in mouth throughout feeding, stimulation needed to elicit sucking reflex.  Audible Swallowing: A few with stimulation  Type of Nipple: Flat ( Pre-pump with hand pump prior to latching infant at the breast.  Comfort (Breast/Nipple): Soft / non-tender  Hold (Positioning): Assistance needed to  correctly position infant at breast and maintain latch.  LATCH Score: 6   Lactation Tools Discussed/Used Tools: Pump Breast pump type: Manual Pump Education: Setup, frequency, and cleaning;Milk Storage Reason for Pumping: pre-pump prior to latching infant due to Birth Parent having flat nipples Pumping frequency: Birth Parent will pre-pump with hand pump prior to latching infant at the breast. Pumped volume: 13 mL  Interventions Interventions: Skin to skin;Assisted with latch;Hand express;Pre-pump if needed;Breast compression;Adjust position;Support pillows;Position options;Expressed milk;Hand pump;Education  Discharge    Consult Status Consult Status: Follow-up Date: 04/07/22 Follow-up type: In-patient    Vicente Serene 04/07/2022, 1:52 AM

## 2022-04-13 ENCOUNTER — Encounter: Payer: Medicaid Other | Admitting: Advanced Practice Midwife

## 2022-04-14 ENCOUNTER — Telehealth (HOSPITAL_COMMUNITY): Payer: Self-pay | Admitting: *Deleted

## 2022-04-14 NOTE — Telephone Encounter (Signed)
Attempted Hospital Discharge Follow-Up Call.  Left voice mail requesting that patient return RN's phone call if patient has any concerns or questions regarding herself or her baby.  

## 2022-05-18 ENCOUNTER — Ambulatory Visit: Payer: Medicaid Other | Admitting: Advanced Practice Midwife

## 2022-07-20 ENCOUNTER — Encounter: Attending: Family

## 2022-07-20 NOTE — Telephone Encounter (Signed)
Called pt to assist in scheduling appt - no response - lvm to request call back to schedule with  call center or me

## 2022-07-26 NOTE — Telephone Encounter (Signed)
Called pt again to try and get her scheduled correctly.  LVM requesting call back to sch

## 2023-05-12 NOTE — Telephone Encounter (Signed)
Called LVM to reschedule, provider not available that day.

## 2023-05-15 NOTE — Telephone Encounter (Signed)
Called LVM to reschedule

## 2023-05-30 ENCOUNTER — Encounter

## 2023-09-05 NOTE — Telephone Encounter (Signed)
 LVM for pt to call back for sched assistance, please assist with OBGYN appt request if patient calls back

## 2023-10-02 IMAGING — US US OB < 14 WEEKS - US OB TV
1 series · 15 of 28 positions shown · non-contrast
Comparison: None.

CLINICAL DATA: Pelvic pain for 2 hours with positive pregnancy test

EXAM:
OBSTETRIC <14 WK US AND TRANSVAGINAL OB US
TECHNIQUE: Both transabdominal and transvaginal ultrasound examinations were
performed for complete evaluation of the gestation as well as the
maternal uterus, adnexal regions, and pelvic cul-de-sac.
Transvaginal technique was performed to assess early pregnancy.

[Series 1: us ob < 14 weeks - us ob tv · 15 of 65 slices shown]
[im 1/65]
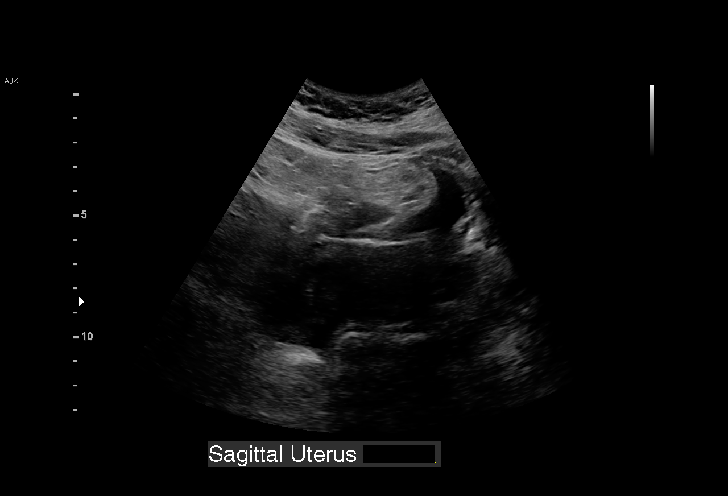
[im 5/65]
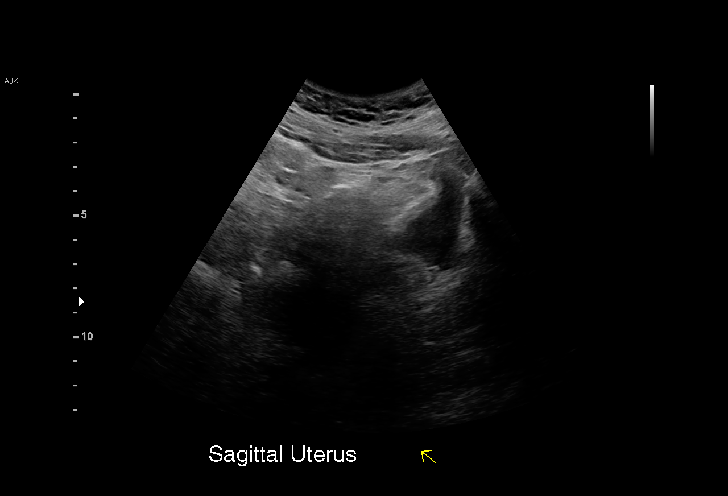
[im 10/65]
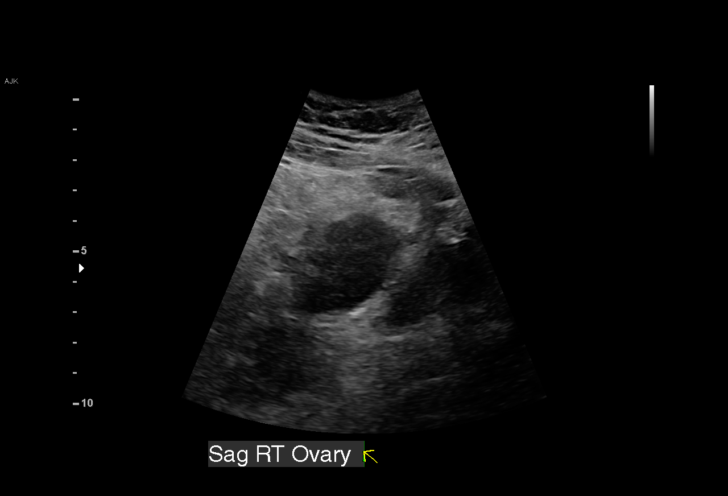
[im 15/65]
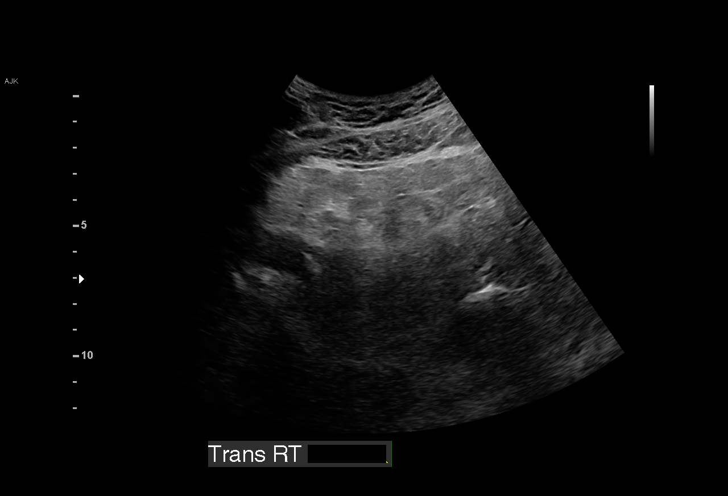
[im 19/65]
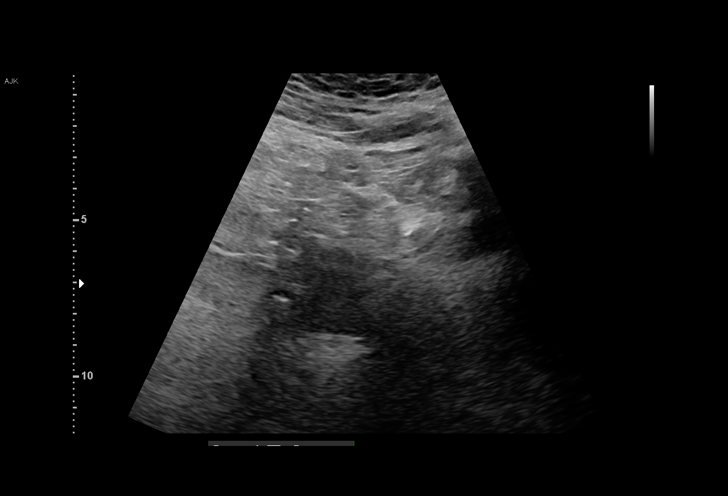
[im 24/65]
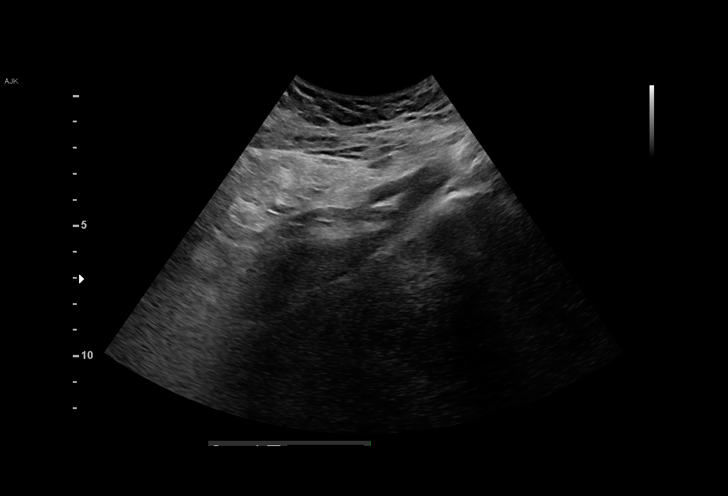
[im 29/65]
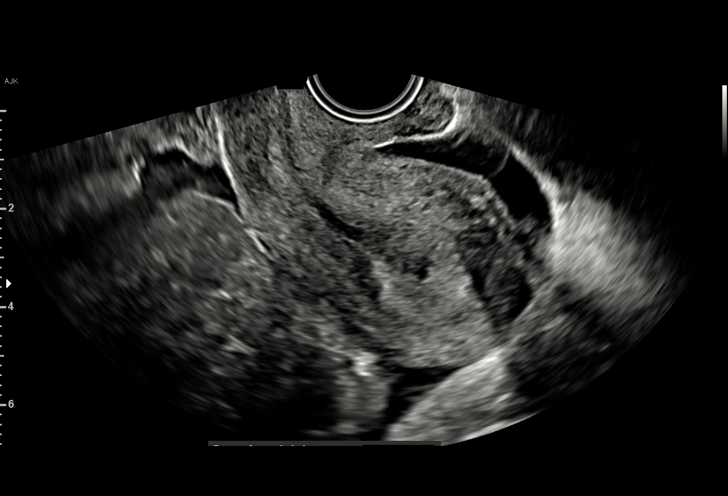
[im 34/65]
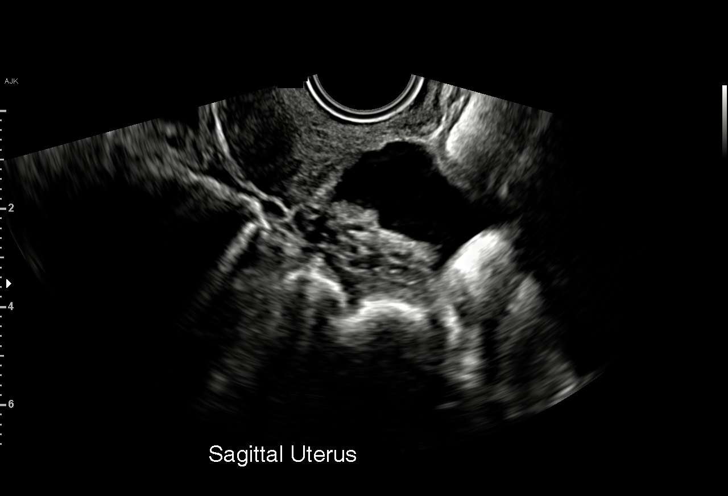
[im 36/65]
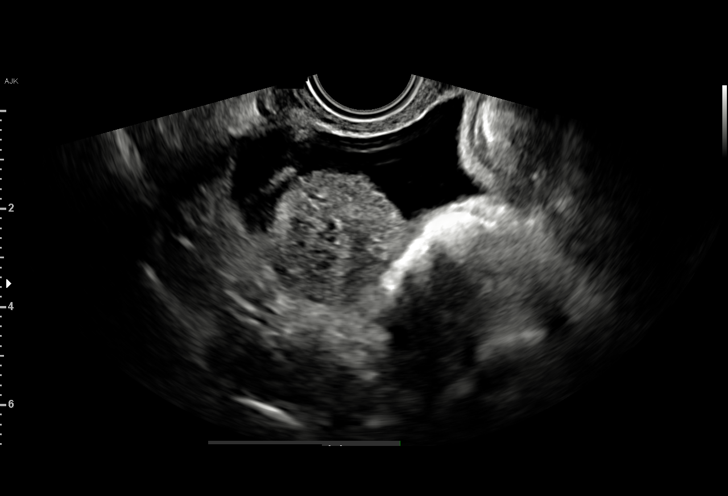
[im 41/65]
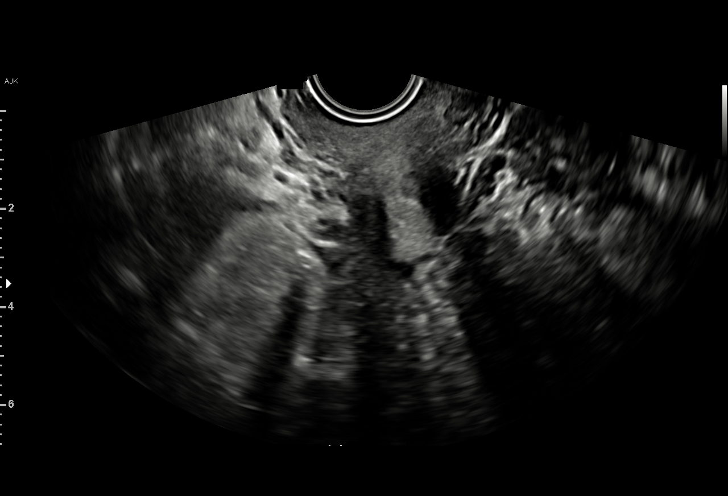
[im 46/65]
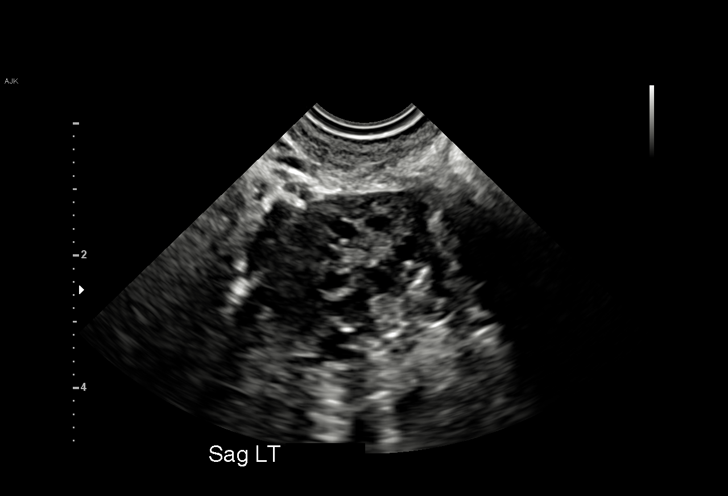
[im 50/65]
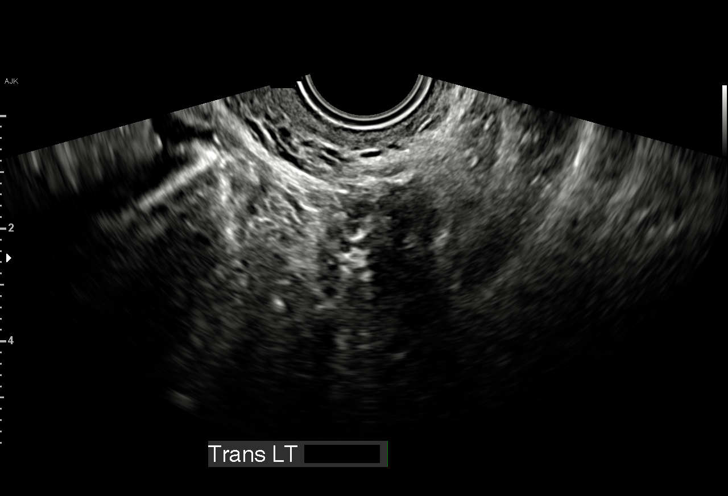
[im 55/65]
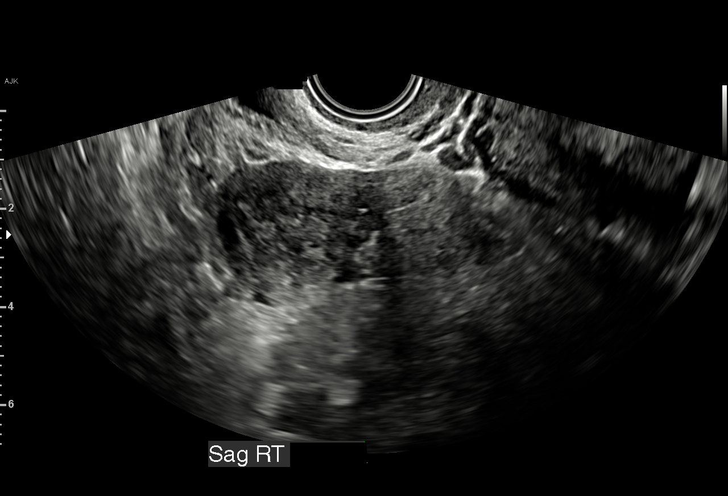
[im 60/65]
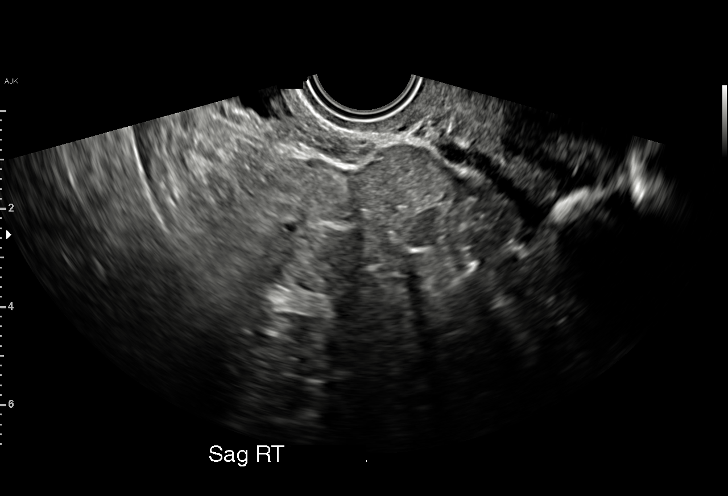
[im 65/65]
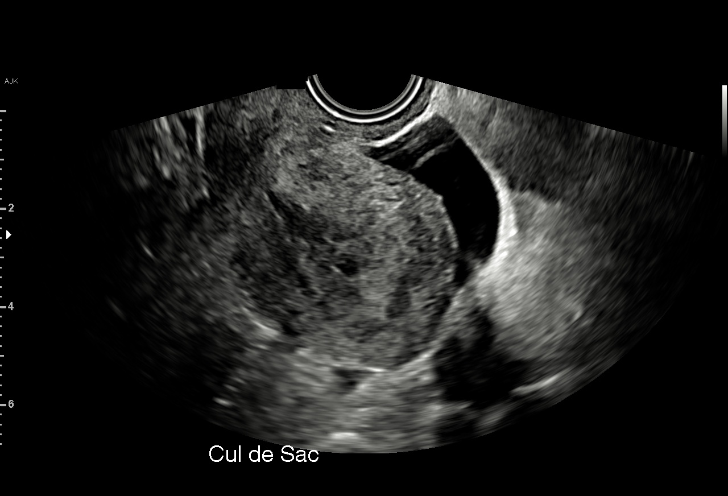

[15 of 28 positions shown; findings below may reference images not displayed]

FINDINGS: Intrauterine gestational sac: Absent

Maternal uterus/adnexae: Ovaries appear within normal limits. Mild
free fluid is noted which may be physiologic in nature.
IMPRESSION: No evidence of intrauterine or extrauterine gestational sac. Given
the low HCG level of 30.7 this likely represents an extremely early
pregnancy. Correlation with follow-up beta HCG levels is
recommended. Follow-up ultrasound can be performed as clinically
necessary.

## 2023-10-11 IMAGING — US US OB TRANSVAGINAL
1 series · 15 of 28 positions shown · non-contrast
Comparison: None.

CLINICAL DATA: Pain since yesterday

EXAM:
TRANSVAGINAL OB ULTRASOUND
TECHNIQUE: Transvaginal ultrasound was performed for complete evaluation of the
gestation as well as the maternal uterus, adnexal regions, and
pelvic cul-de-sac.

[Series 1: us ob transvaginal · 15 of 32 slices shown]
[im 1/32]
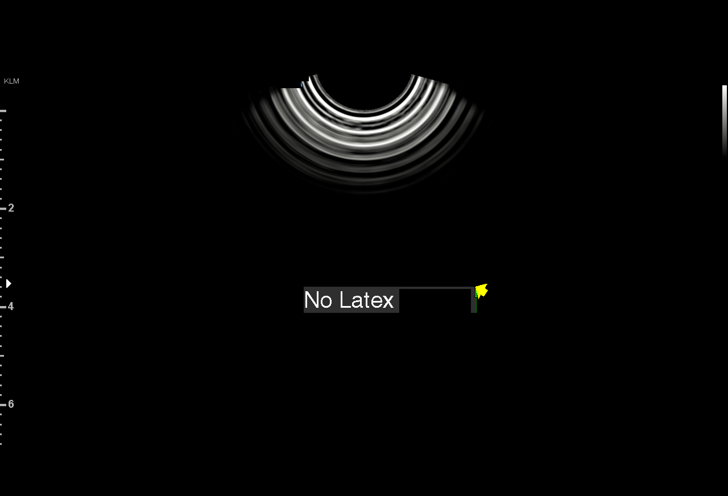
[im 3/32]
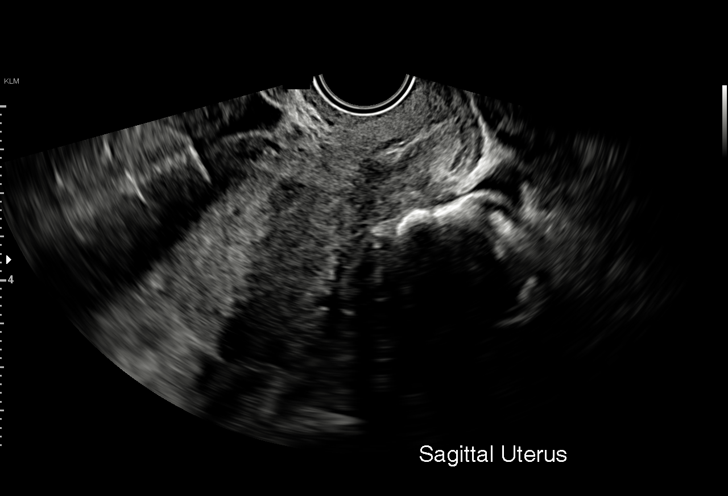
[im 5/32]
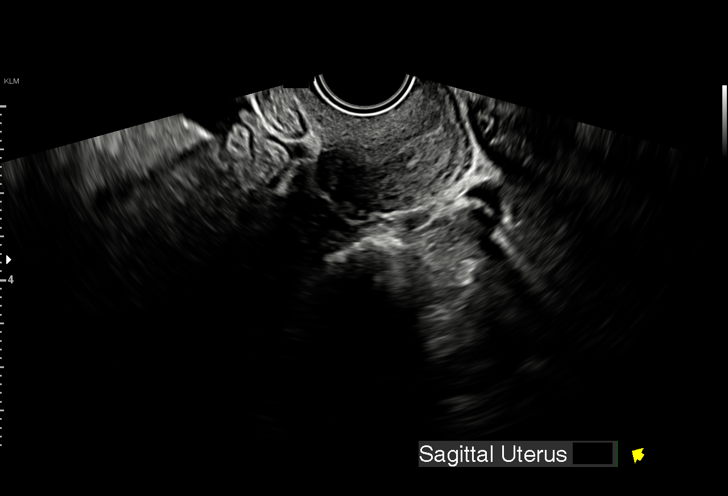
[im 7/32]
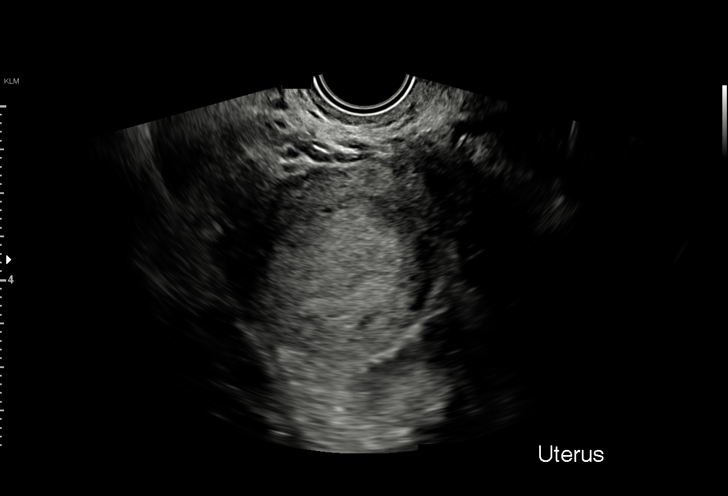
[im 10/32]
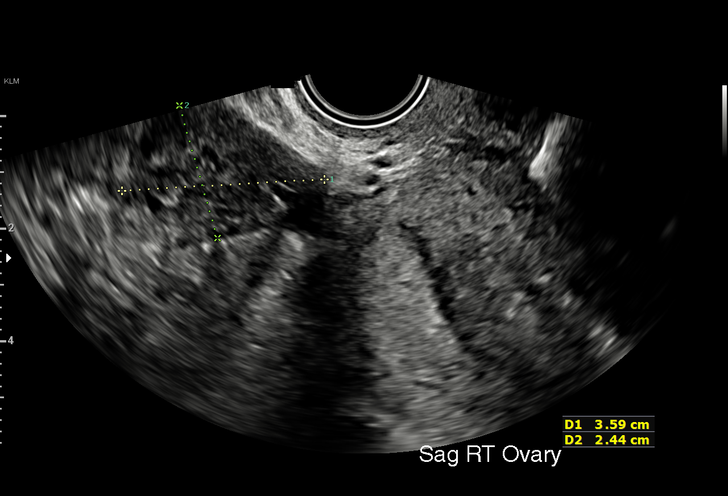
[im 12/32]
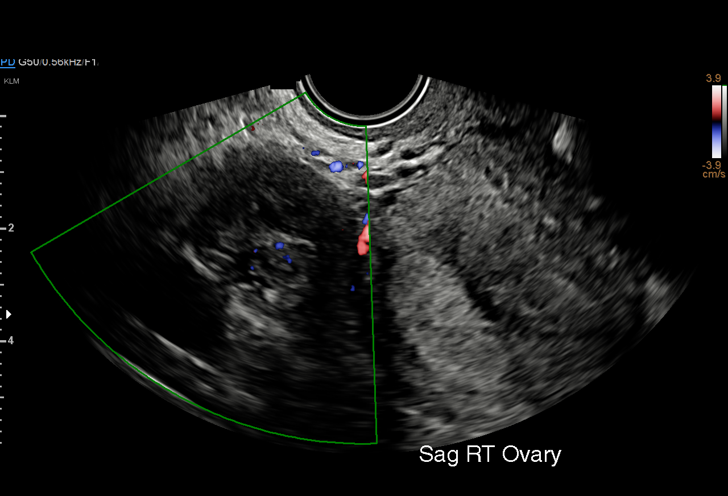
[im 14/32]
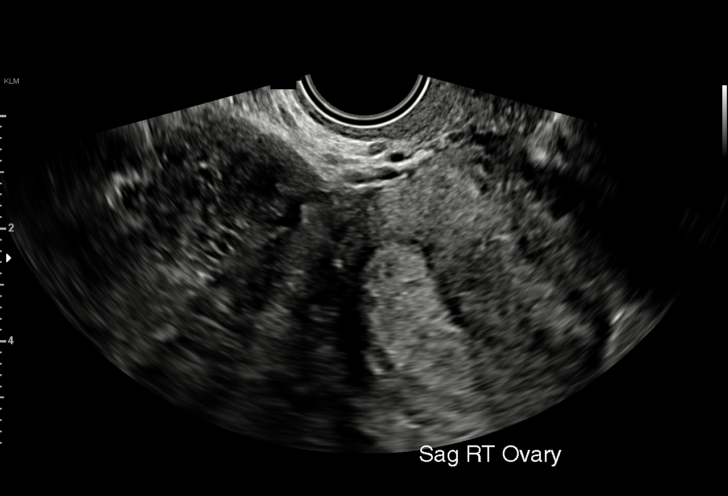
[im 17/32]
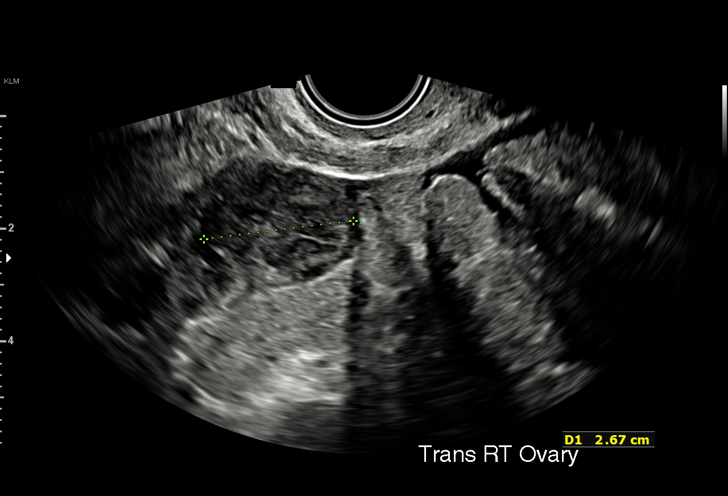
[im 18/32]
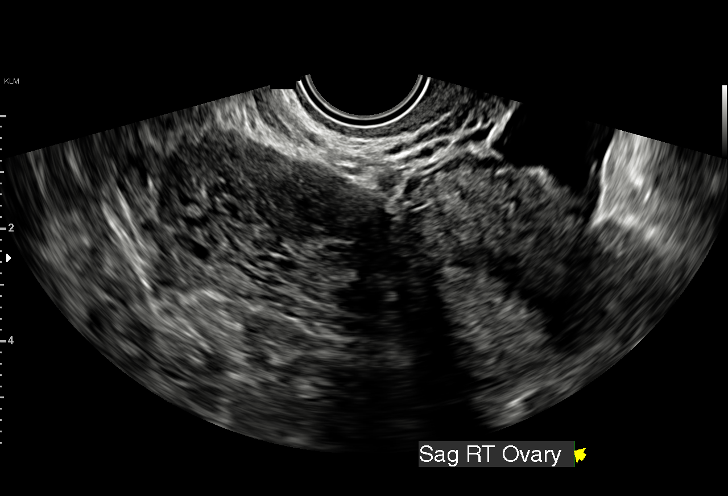
[im 20/32]
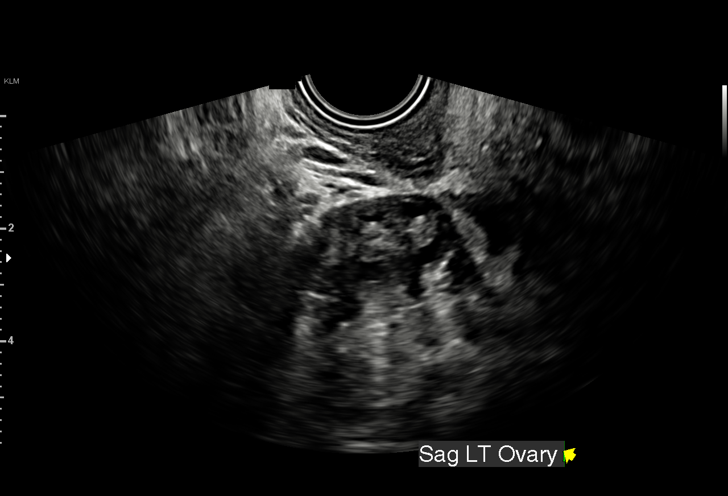
[im 22/32]
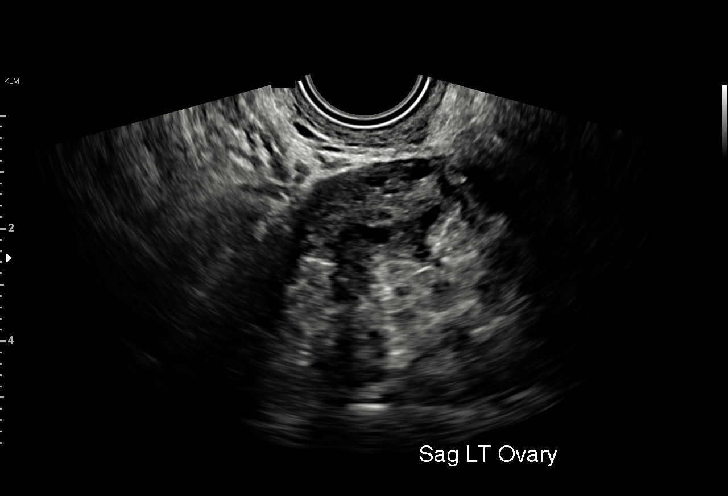
[im 25/32]
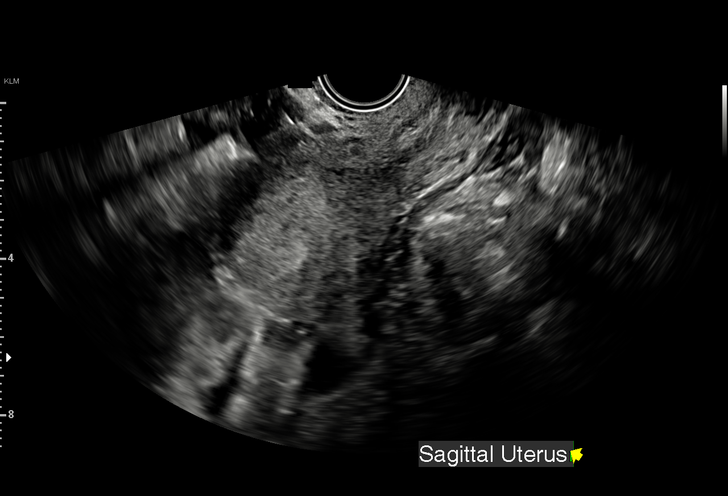
[im 27/32]
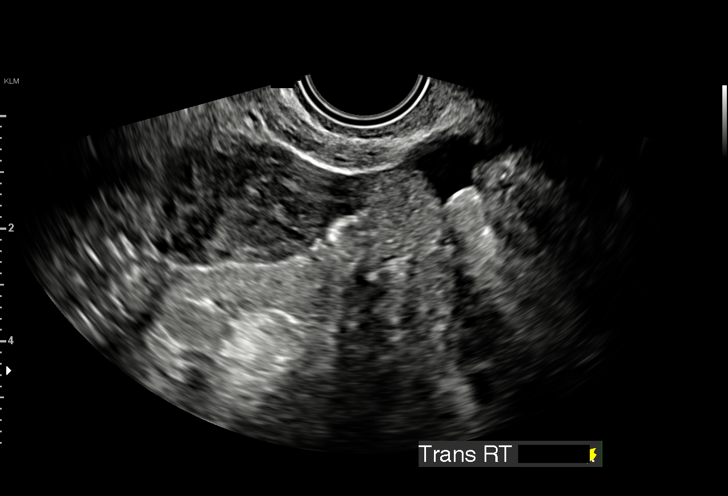
[im 29/32]
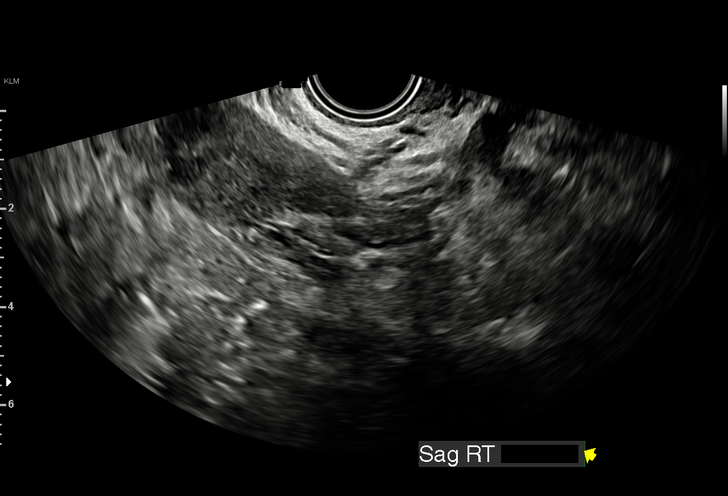
[im 32/32]
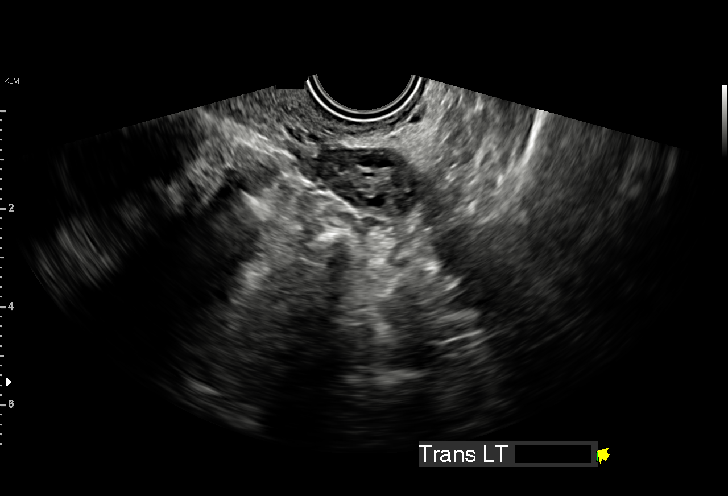

[15 of 28 positions shown; findings below may reference images not displayed]

FINDINGS: Intrauterine gestational sac: None

Yolk sac:  Not Visualized.

Embryo:  Not Visualized.

Cardiac Activity: Not Visualized.

Heart Rate:  bpm

MSD:   mm    w     d

CRL:     mm    w  d                  US EDC:

Subchorionic hemorrhage:  None visualized.

Maternal uterus/adnexae: No adnexal mass. Small amount of free fluid
in the pelvis.
IMPRESSION: No intrauterine pregnancy visualized. Differential considerations
would include early intrauterine pregnancy too early to visualize,
spontaneous abortion, or occult ectopic pregnancy. Recommend close
clinical followup and serial quantitative beta HCGs and ultrasounds.

## 2023-10-18 IMAGING — US US OB < 14 WEEKS - US OB TV
1 series · 15 of 28 positions shown · non-contrast
Comparison: 08/11/2021, 08/02/2021

CLINICAL DATA: Viability and dating

EXAM:
OBSTETRIC <14 WK US AND TRANSVAGINAL OB US
TECHNIQUE: Both transabdominal and transvaginal ultrasound examinations were
performed for complete evaluation of the gestation as well as the
maternal uterus, adnexal regions, and pelvic cul-de-sac.
Transvaginal technique was performed to assess early pregnancy.

[Series 1: us ob < 14 weeks - us ob tv · 57 acquisitions, 15 frames shown]
[im 1/57]
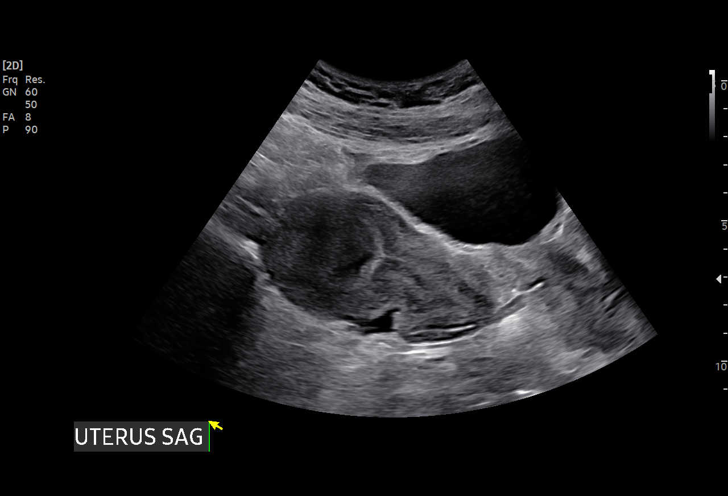
[im 5/57]
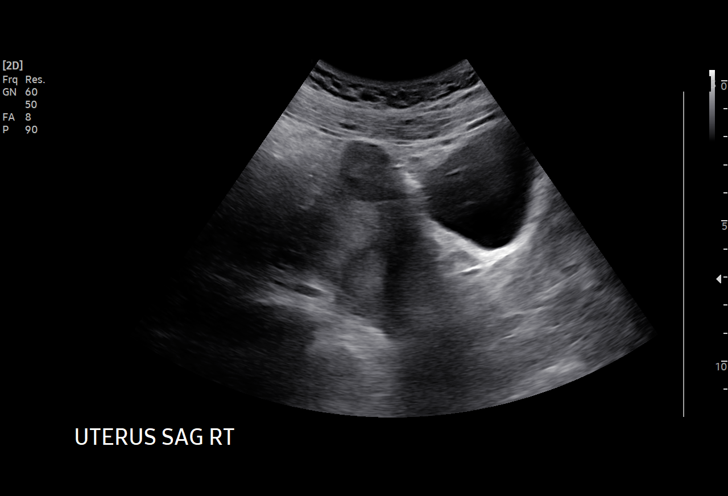
[im 9/57]
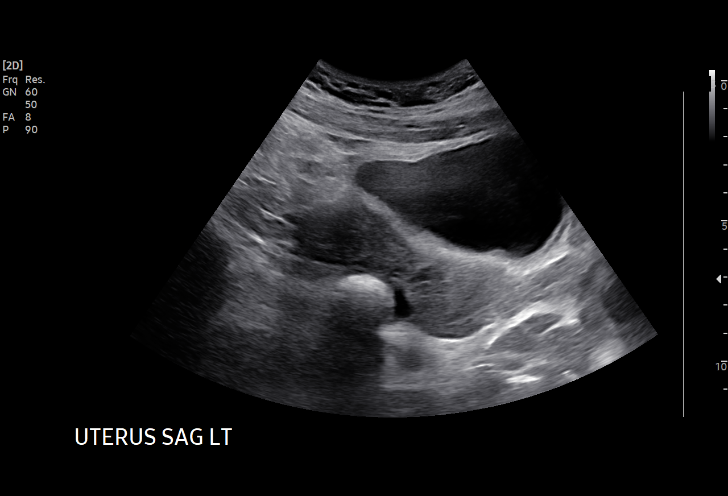
[im 13/57]
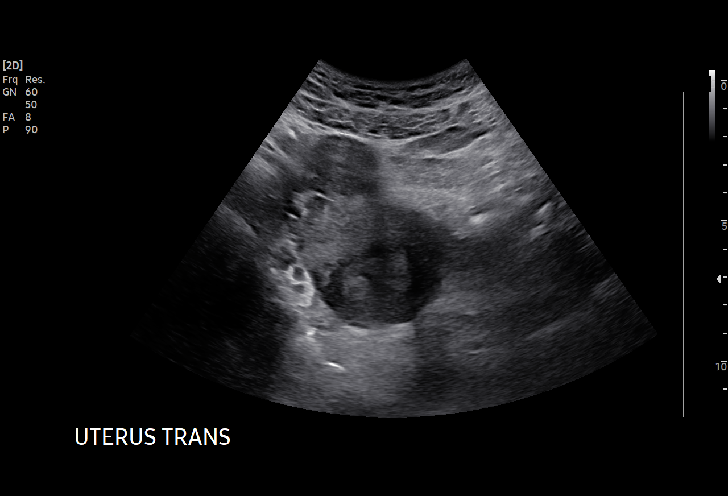
[im 17/57]
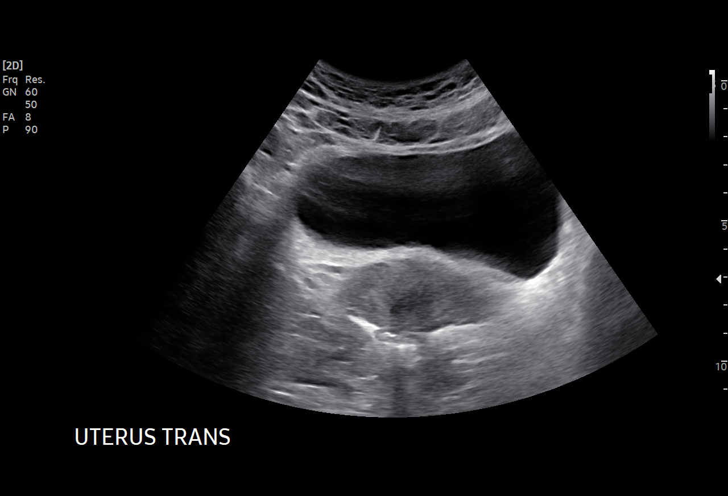
[im 21/57]
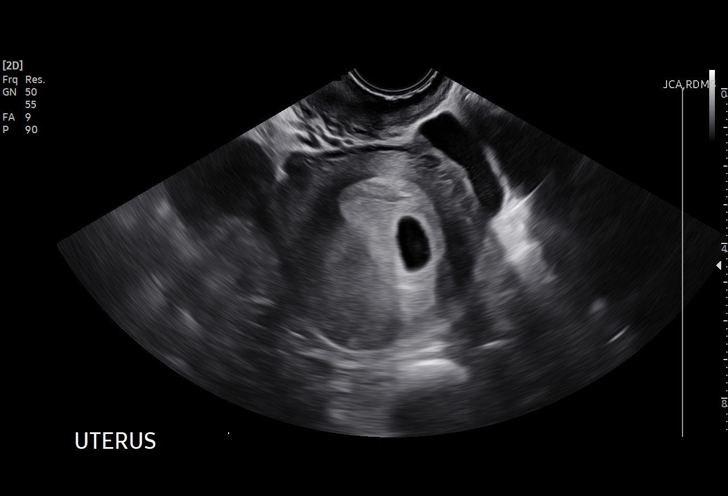
[im 25/57]
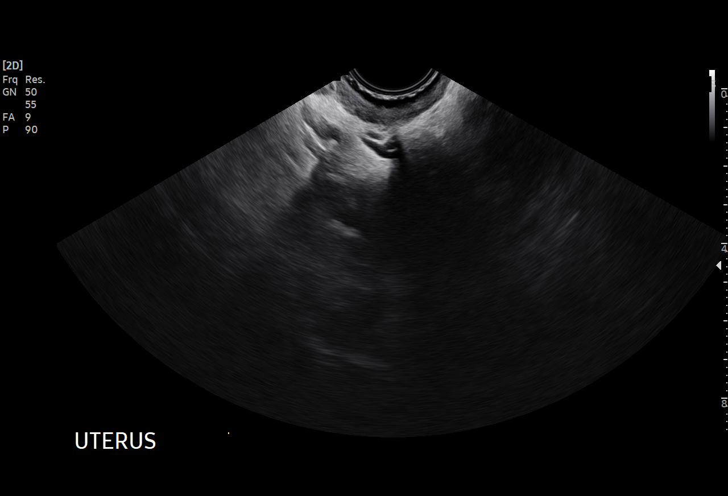
[im 30/57]
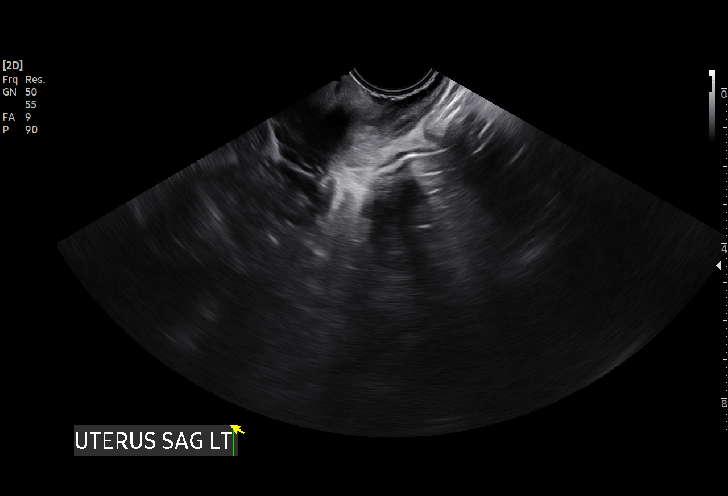
[im 32/57]
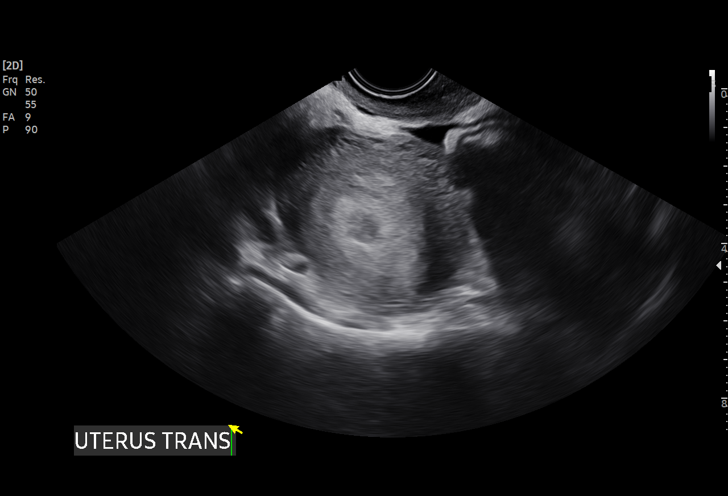
[im 36/57]
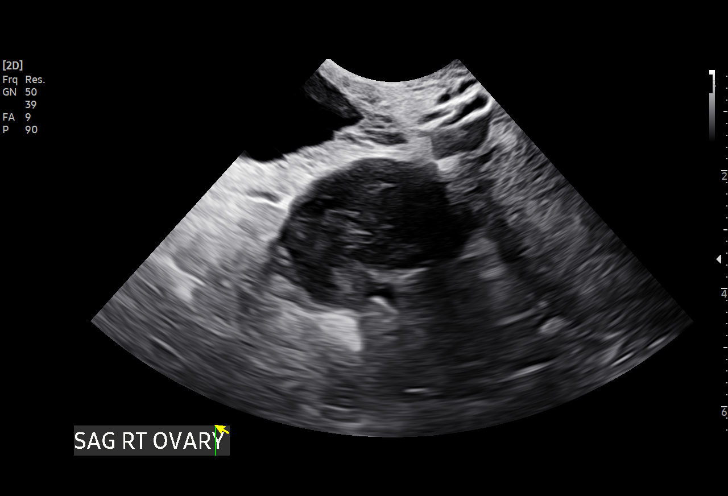
[im 40/57]
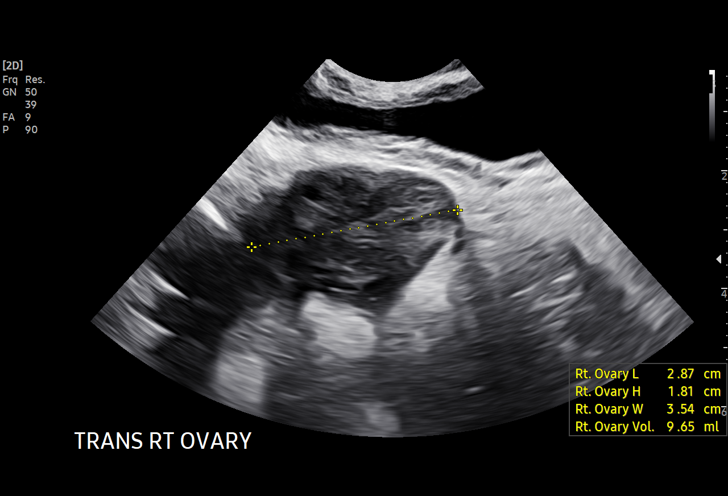
[im 44/57]
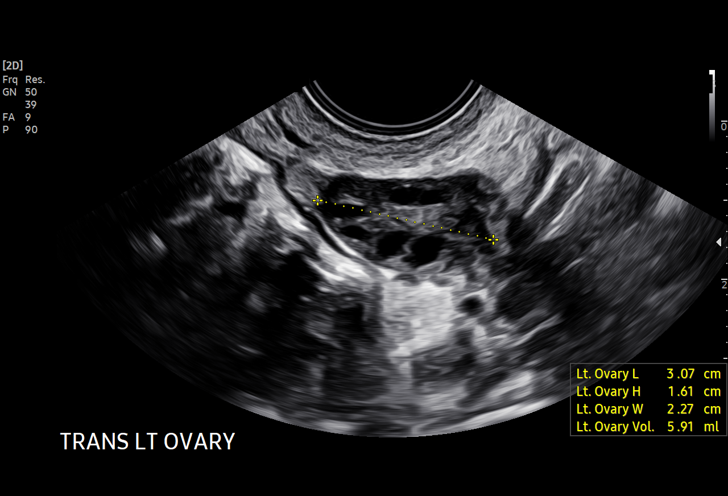
[im 48/57]
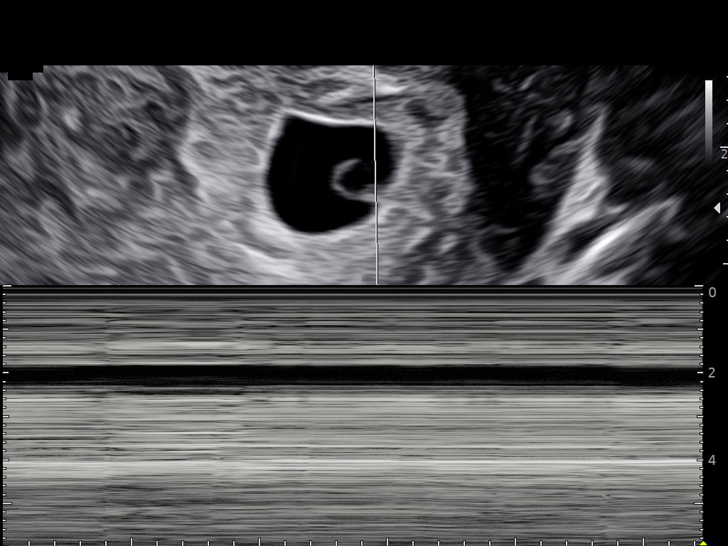
[im 52/57]
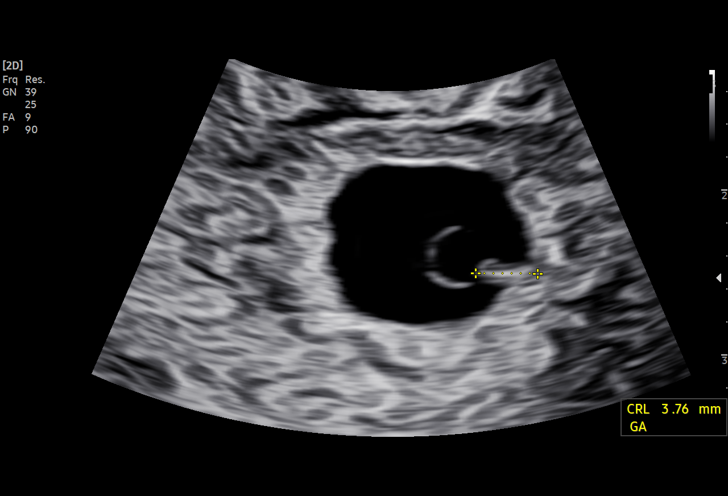
[im 57/57]
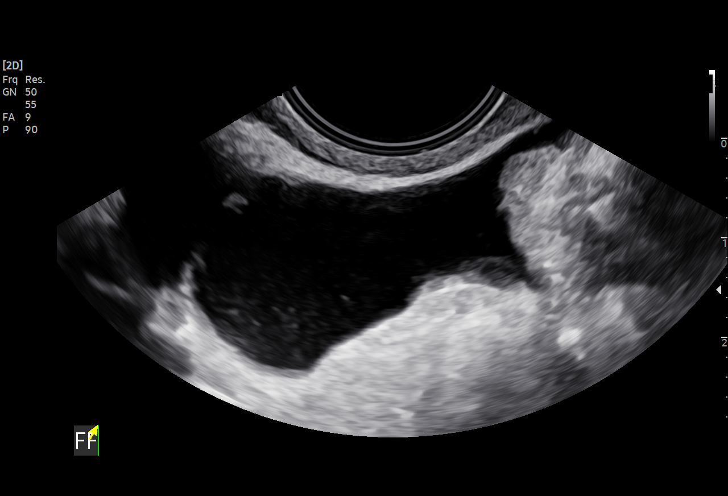

[15 of 28 positions shown; findings below may reference images not displayed]

FINDINGS: Intrauterine gestational sac: Single

Yolk sac:  Visualized

Embryo:  Visualized

Cardiac Activity: Visualized

Heart Rate: 98 bpm

CRL:  37 mm   6 w   1 d                  US EDC: 04/12/2022

Subchorionic hemorrhage:  None visualized.

Maternal uterus/adnexae: Ovaries are within normal limits. Right
ovary measures 2.9 x 1.8 x 3.5 cm. Left ovary measures 3.1 x 1.6 x
2.3 cm. Small moderate free fluid in the pelvis.
IMPRESSION: 1. Single viable intrauterine pregnancy, but with fetal cardiac
activity of 98 bpm/mild bradycardia.
2. Small moderate free fluid in the pelvis.

## 2023-11-01 IMAGING — US US OB COMP LESS 14 WK
1 series · 15 of 28 positions shown · non-contrast
Comparison: Obstetric ultrasound 08/18/2021

CLINICAL DATA: Follow-up bradycardia

EXAM:
OBSTETRIC <14 WK ULTRASOUND
TECHNIQUE: Transabdominal ultrasound was performed for evaluation of the
gestation as well as the maternal uterus and adnexal regions.

[Series 1: us ob comp less 14 wk · 15 of 41 slices shown]
[im 1/41]
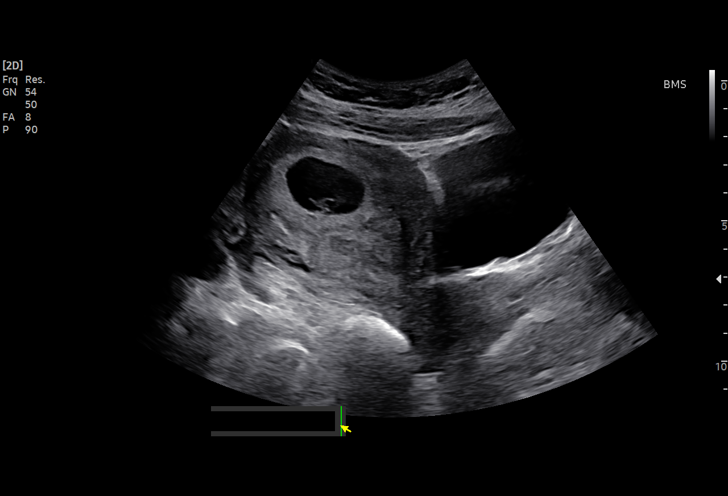
[im 3/41]
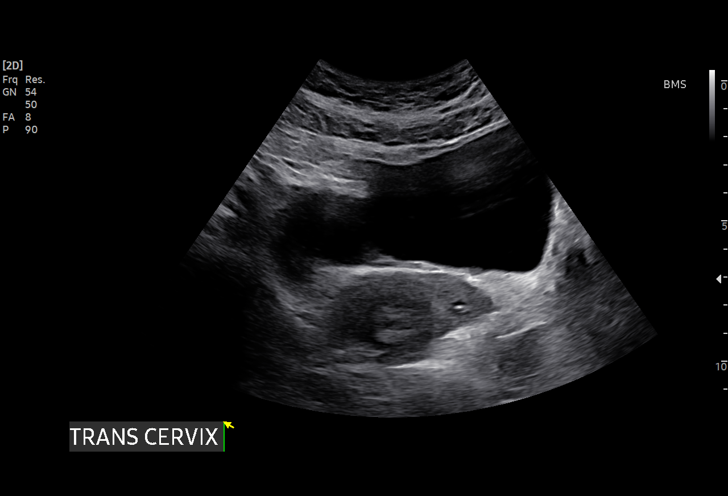
[im 6/41]
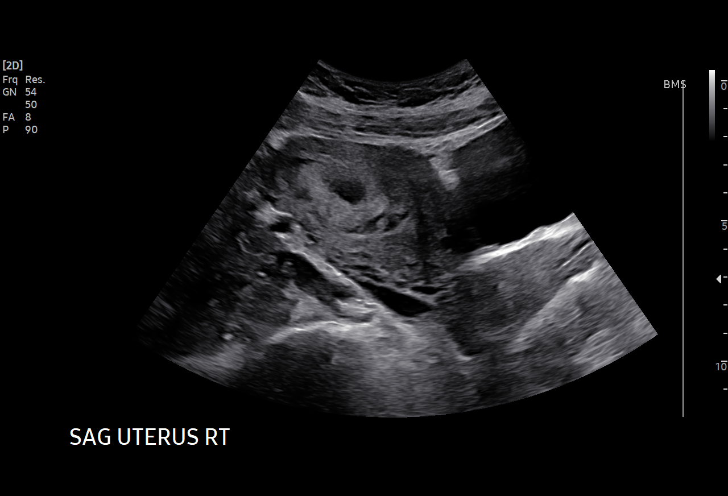
[im 9/41]
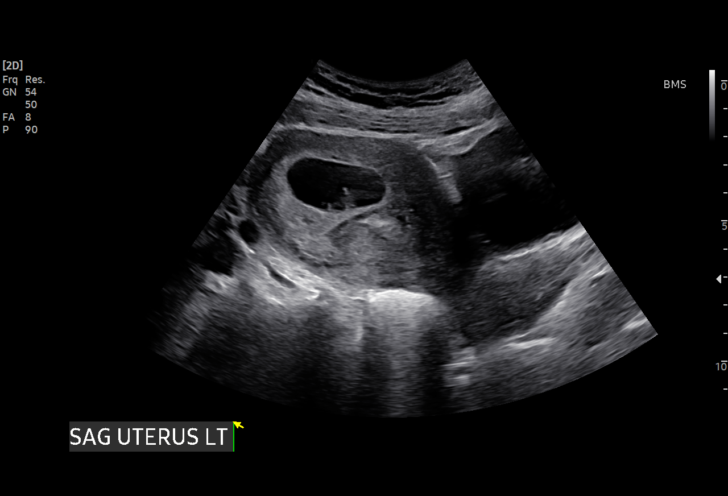
[im 12/41]
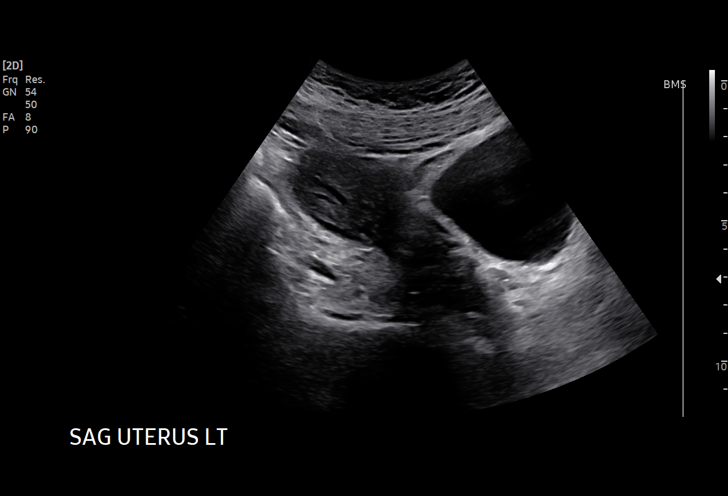
[im 15/41]
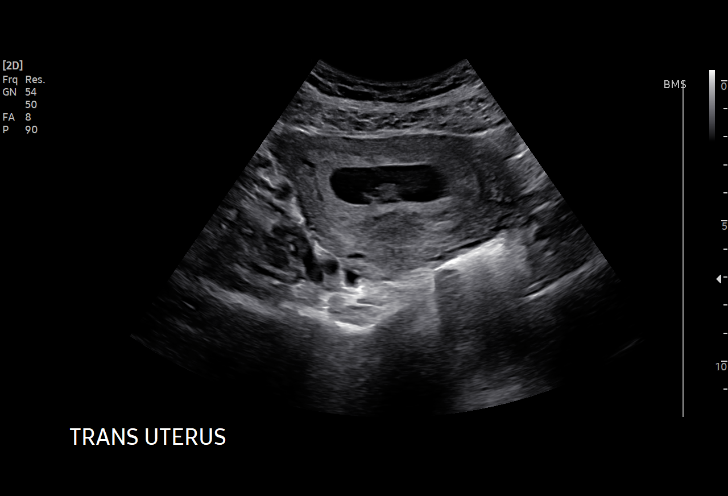
[im 18/41]
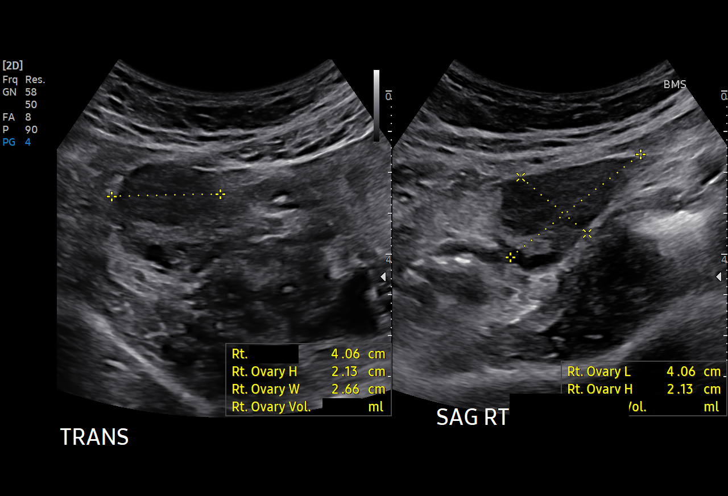
[im 21/41]
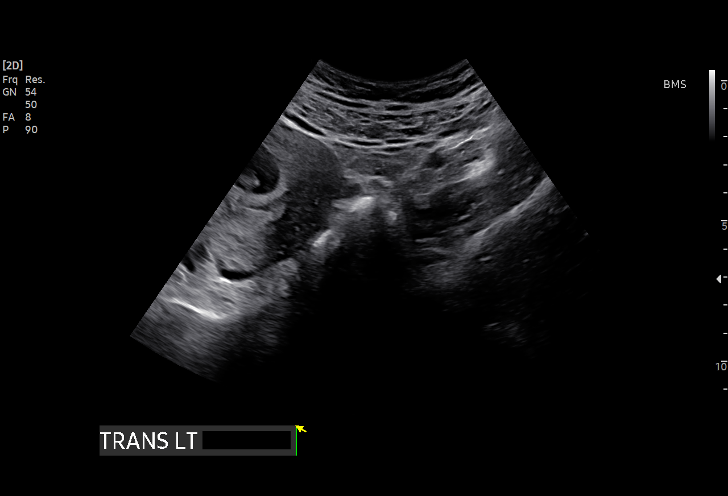
[im 23/41]
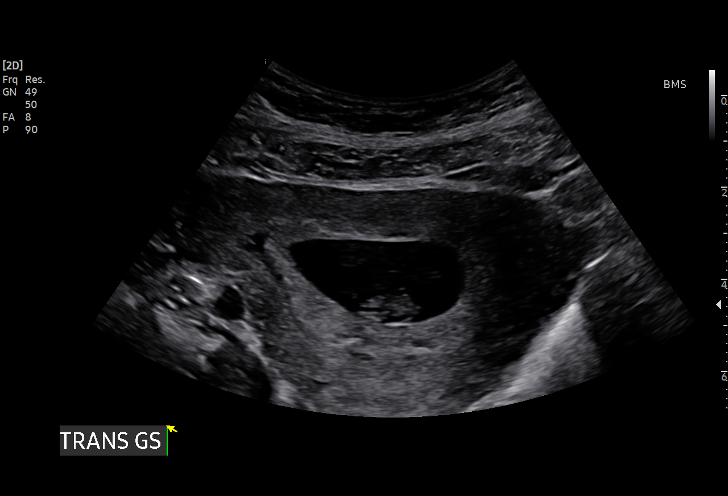
[im 26/41]
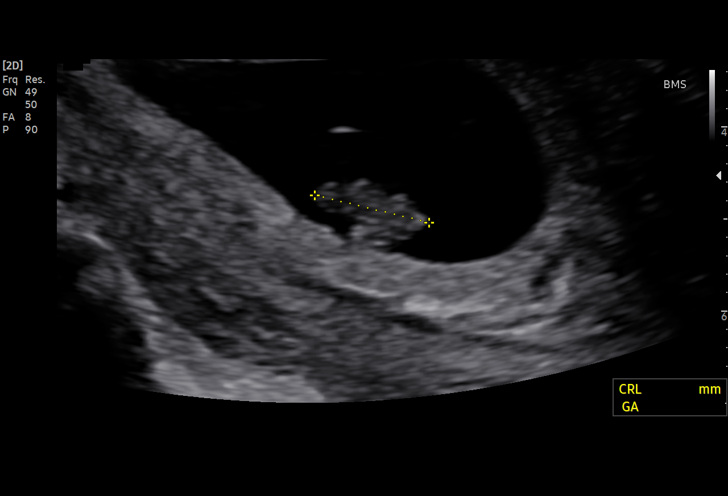
[im 29/41]
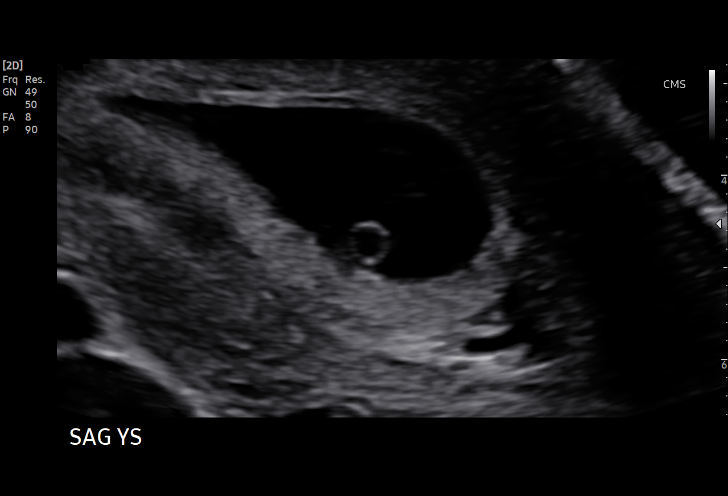
[im 32/41]
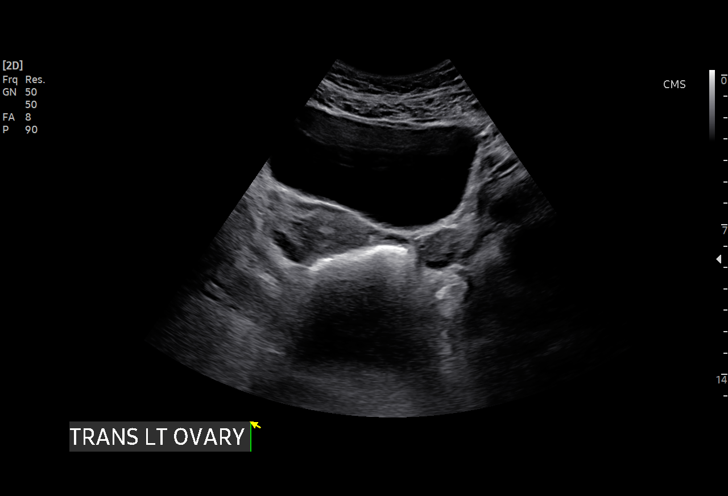
[im 35/41]
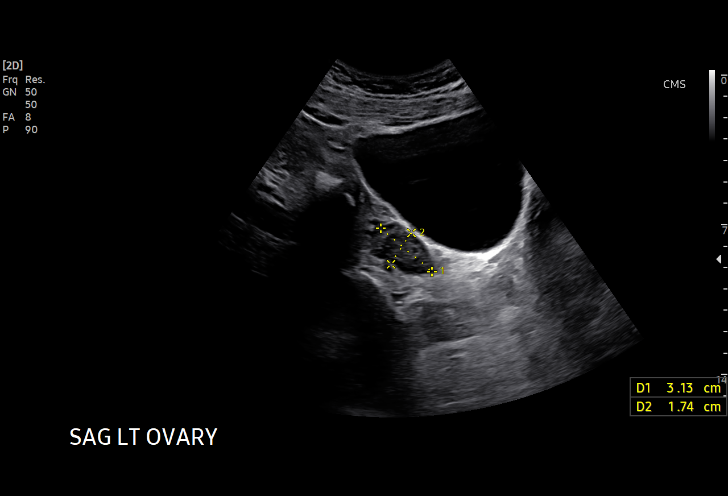
[im 38/41]
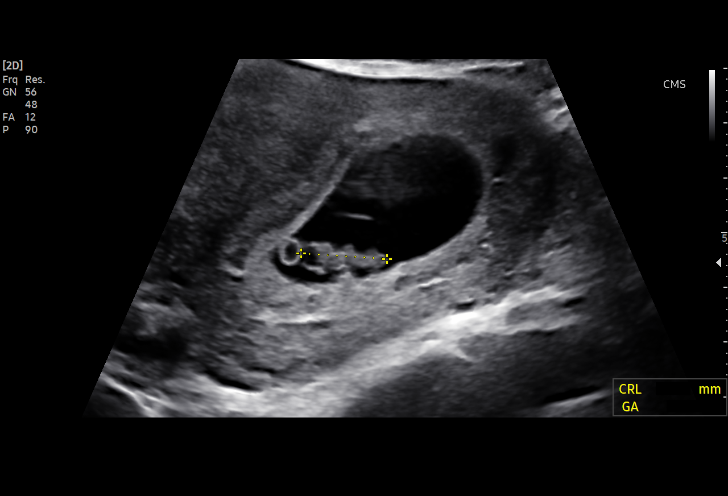
[im 41/41]
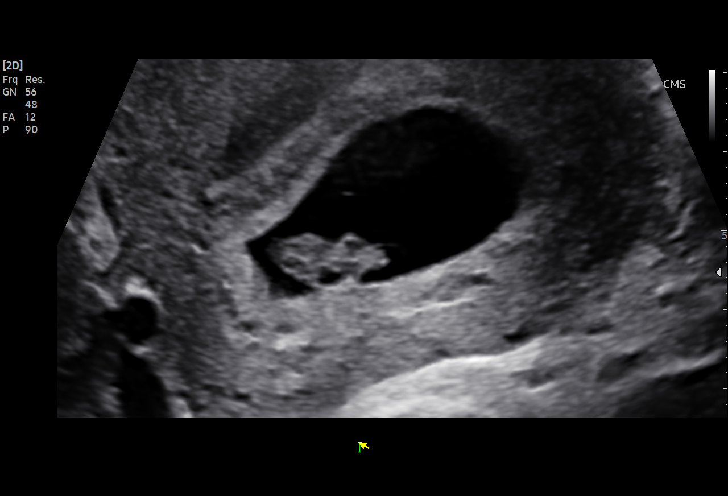

[15 of 28 positions shown; findings below may reference images not displayed]

FINDINGS: Intrauterine gestational sac: Single

Yolk sac:  Visualized.

Embryo:  Visualized.

Cardiac Activity: Visualized.

Heart Rate: 167 bpm

CRL: 15.7 mm   8 w 0 d                  US EDC: 04/13/2022

Subchorionic hemorrhage:  None visualized.

Maternal uterus/adnexae: The ovaries are normal in appearance,
measuring 4.1 cm x 2.1 cm x 2.7 cm on the right and 2.0 cm x 3.1 cm
x 1.7 cm on the left. There is trace free fluid.
IMPRESSION: Single live intrauterine pregnancy identified with an estimated
gestational age of 8 weeks 0 days. Fetal heart rate 167.
